# Patient Record
Sex: Female | Born: 1955 | Race: White | Hispanic: No | Marital: Single | Smoking: Current some day smoker
Health system: Southern US, Community
[De-identification: ages and names within clinical notes are randomized; demographics above are authoritative.]

## PROBLEM LIST (undated history)

## (undated) DIAGNOSIS — E785 Hyperlipidemia, unspecified: Secondary | ICD-10-CM

## (undated) DIAGNOSIS — I10 Essential (primary) hypertension: Secondary | ICD-10-CM

## (undated) DIAGNOSIS — I639 Cerebral infarction, unspecified: Secondary | ICD-10-CM

## (undated) DIAGNOSIS — J449 Chronic obstructive pulmonary disease, unspecified: Secondary | ICD-10-CM

## (undated) DIAGNOSIS — I219 Acute myocardial infarction, unspecified: Secondary | ICD-10-CM

## (undated) DIAGNOSIS — I519 Heart disease, unspecified: Secondary | ICD-10-CM

## (undated) HISTORY — DX: Chronic obstructive pulmonary disease, unspecified: J44.9

## (undated) HISTORY — PX: TUBAL LIGATION: SHX77

## (undated) HISTORY — DX: Hyperlipidemia, unspecified: E78.5

## (undated) HISTORY — DX: Acute myocardial infarction, unspecified: I21.9

## (undated) HISTORY — PX: APPENDECTOMY: SHX54

## (undated) HISTORY — DX: Essential (primary) hypertension: I10

## (undated) HISTORY — DX: Heart disease, unspecified: I51.9

## (undated) HISTORY — DX: Cerebral infarction, unspecified: I63.9

## (undated) HISTORY — PX: CORONARY ANGIOPLASTY WITH STENT PLACEMENT: SHX49

---

## 2003-07-25 DIAGNOSIS — I219 Acute myocardial infarction, unspecified: Secondary | ICD-10-CM

## 2003-07-25 HISTORY — DX: Acute myocardial infarction, unspecified: I21.9

## 2003-10-11 ENCOUNTER — Inpatient Hospital Stay (HOSPITAL_COMMUNITY): Admission: EM | Admit: 2003-10-11 | Discharge: 2003-10-13 | Payer: Self-pay | Admitting: *Deleted

## 2013-11-19 ENCOUNTER — Institutional Professional Consult (permissible substitution): Payer: Self-pay | Admitting: Internal Medicine

## 2013-11-25 ENCOUNTER — Encounter: Payer: Self-pay | Admitting: Internal Medicine

## 2013-11-25 ENCOUNTER — Encounter (INDEPENDENT_AMBULATORY_CARE_PROVIDER_SITE_OTHER): Payer: Self-pay

## 2013-11-25 ENCOUNTER — Institutional Professional Consult (permissible substitution): Payer: Medicare Other | Admitting: Internal Medicine

## 2013-11-25 ENCOUNTER — Ambulatory Visit (INDEPENDENT_AMBULATORY_CARE_PROVIDER_SITE_OTHER): Payer: Medicare Other | Admitting: Internal Medicine

## 2013-11-25 ENCOUNTER — Ambulatory Visit (INDEPENDENT_AMBULATORY_CARE_PROVIDER_SITE_OTHER)
Admission: RE | Admit: 2013-11-25 | Discharge: 2013-11-25 | Disposition: A | Discharge status: Home / Self Care | Payer: Medicare Other | Source: Ambulatory Visit | Attending: Internal Medicine | Admitting: Internal Medicine

## 2013-11-25 VITALS — BP 140/90 | HR 67 | Temp 98.1°F | Ht 64.0 in | Wt 201.6 lb

## 2013-11-25 DIAGNOSIS — F17200 Nicotine dependence, unspecified, uncomplicated: Secondary | ICD-10-CM | POA: Insufficient documentation

## 2013-11-25 DIAGNOSIS — IMO0001 Reserved for inherently not codable concepts without codable children: Secondary | ICD-10-CM | POA: Insufficient documentation

## 2013-11-25 DIAGNOSIS — J449 Chronic obstructive pulmonary disease, unspecified: Secondary | ICD-10-CM

## 2013-11-25 DIAGNOSIS — I10 Essential (primary) hypertension: Secondary | ICD-10-CM

## 2013-11-25 MED ORDER — VALSARTAN 160 MG PO TABS: 160.0000 mg | ORAL_TABLET | Freq: Every day | ORAL | Status: DC

## 2013-11-25 NOTE — Assessment & Plan Note (Signed)
ACE inhibitors are problematic in  pts with airway complaints because  even experienced pulmonologists can't always distinguish ace effects from copd/asthma.  By themselves they don't actually cause a problem, much like oxygen can't by itself start a fire, but they certainly serve as a powerful catalyst or enhancer for any "fire"  or inflammatory process in the upper airway, be it caused by an ET  tube or more commonly reflux (especially in the obese or pts with known GERD or who are on biphoshonates).    In the era of ARB near equivalency until we have a better handle on the reversibility of the airway problem, it just makes sense to avoid ACEI  entirely in the short run and then decide later, having established a level of airway control using a reasonable limited regimen, whether to add back ace but even then being very careful to observe the pt for worsening airway control and number of meds used/ needed to control symptoms.    Try off lisinopril and on diovan 160 mg daily

## 2013-11-25 NOTE — Assessment & Plan Note (Signed)
>   3 min discussion I reviewed the Flethcher curve with patient that basically indicates  if you quit smoking when your best day FEV1 is still well preserved (as is likely  the case here)  it is highly unlikely you will progress to severe disease and informed the patient there was no medication on the market that has proven to change the curve or the likelihood of progression.  Therefore stopping smoking and maintaining abstinence is the most important aspect of care, not choice of inhalers or for that matter, doctors.

## 2013-11-25 NOTE — Progress Notes (Signed)
   Subjective:    Patient ID: Martha Mueller, female    DOB: 08-26-1955  MRN: [id number]  HPI  19 yowf smoker with doe x 2005 with some allergies to grass as child but never wheezed or needed inhalers referred by Yetta Barre 11/25/2013 to pulmonary clinic with ? Copd   11/25/2013 1st [facility] office visit/ Wert  Chief Complaint  Patient presents with  . Pulmonary Consult    Referred Dr. Kennith Maes.  Pt c/o SOB for the past several years- gradually getting worse.  She gets SOB with walking minimal distances and with daily activities like bathing. Her cough is prod with minimal white sputum- esp in the am.   on inhalers since 2011 breathing no better on advair or symbicort ? While on ACEi > hfa saba works best Choking at night but no excess mucus, wakes her from sound sleep, uses saba and back to sleep after that, assoc with overt HB  No obvious other patterns in day to day or daytime variabilty or assoc chronic cough or cp or chest tightness, subjective wheeze or overt sinus   symptoms. No unusual exp hx or h/o childhood pna/ asthma or knowledge of premature birth.  Sleeping ok without nocturnal  or early am exacerbation  of respiratory  c/o's or need for noct saba. Also denies any obvious fluctuation of symptoms with weather or environmental changes or other aggravating or alleviating factors except as outlined above   Current Medications, Allergies, Complete Past Medical History, Past Surgical History, Family History, and Social History were reviewed in [employer] record.               Review of Systems  Constitutional: Negative for fever, chills and unexpected weight change.  HENT: Negative for congestion, dental problem, ear pain, nosebleeds, postnasal drip, rhinorrhea, sinus pressure, sneezing, sore throat, trouble swallowing and voice change.   Eyes: Negative for visual disturbance.  Respiratory: Positive for cough and shortness of breath. Negative  for choking.   Cardiovascular: Negative for chest pain and leg swelling.  Gastrointestinal: Negative for vomiting, abdominal pain and diarrhea.  Genitourinary: Negative for difficulty urinating.       Acid Heartburn  Musculoskeletal: Negative for arthralgias.  Skin: Negative for rash.  Neurological: Negative for tremors, syncope and headaches.  Hematological: Does not bruise/bleed easily.       Objective:   Physical Exam  amb wf nad  Wt Readings from Last 3 Encounters:  11/25/13 201 lb 9.6 oz (91.445 kg)      HEENT: nl dentition, turbinates, and orophanx. Nl external ear canals without cough reflex   NECK :  without JVD/Nodes/TM/ nl carotid upstrokes bilaterally   LUNGS: no acc muscle use, clear to A and P bilaterally without cough on insp or exp maneuvers   CV:  RRR  no s3 or murmur or increase in P2, no edema   ABD:  soft and nontender with nl excursion in the supine position. No bruits or organomegaly, bowel sounds nl  MS:  warm without deformities, calf tenderness, cyanosis or clubbing  SKIN: warm and dry without lesions    NEURO:  alert, approp, no deficits    CXR  11/25/2013 :   COPD/chronic bronchitis and scarring. No acute superimposed process.       Assessment & Plan:

## 2013-11-25 NOTE — Patient Instructions (Addendum)
Try off lisinopril  Start diovan (valsartan) 160 mg daily   Continue spiriva each am   Only use your albuterol (proair) as a rescue medication to be used if you can't catch your breath by resting or doing a relaxed purse lip breathing pattern.  - The less you use it, the better it will work when you need it. - Ok to use up to 2 puffs  every 4 hours if you must but call for immediate appointment if use goes up over your usual need - Don't leave home without it !!  (think of it like the spare tire for your car)   Please remember to go to the  x-ray department downstairs for your tests - we will call you with the results when they are available.    Please schedule a follow up office visit in 6 weeks, call sooner if needed with pfts

## 2013-11-25 NOTE — Progress Notes (Signed)
Quick Note:  Spoke with pt and notified of results per Dr. Wert. Pt verbalized understanding and denied any questions.  ______ 

## 2013-11-25 NOTE — Assessment & Plan Note (Signed)
DDX of  difficult airways managment all start with A and  include Adherence, Ace Inhibitors, Acid Reflux, Active Sinus Disease, Alpha 1 Antitripsin deficiency, Anxiety masquerading as Airways dz,  ABPA,  allergy(esp in young), Aspiration (esp in elderly), Adverse effects of DPI,  Active smokers, plus two Bs  = Bronchiectasis and Beta blocker use..and one C= CHF  Adherence is always the initial "prime suspect" and is a multilayered concern that requires a "trust but verify" approach in every patient - starting with knowing how to use medications, especially inhalers, correctly, keeping up with refills and understanding the fundamental difference between maintenance and prns vs those medications only taken for a very short course and then stopped and not refilled.  The proper method of use, as well as anticipated side effects, of a metered-dose inhaler are discussed and demonstrated to the patient. Improved effectiveness after extensive coaching during this visit to a level of approximately  75% so ok to continue prn saba   ? acei effects, esp with noct choking > try off, see hbp  ? Acid (or non-acid) GERD > always difficult to exclude as up to 75% of pts in some series report no assoc GI/ Heartburn symptoms> rec max (24h)  acid suppression and diet restrictions/ reviewed and instructions given in writing.

## 2013-12-05 ENCOUNTER — Encounter: Payer: Self-pay | Admitting: Cardiology

## 2013-12-05 ENCOUNTER — Ambulatory Visit (INDEPENDENT_AMBULATORY_CARE_PROVIDER_SITE_OTHER): Payer: Medicare Other | Admitting: Cardiology

## 2013-12-05 VITALS — BP 192/99 | HR 73 | Ht 64.0 in | Wt 200.8 lb

## 2013-12-05 DIAGNOSIS — I252 Old myocardial infarction: Secondary | ICD-10-CM

## 2013-12-05 DIAGNOSIS — F17200 Nicotine dependence, unspecified, uncomplicated: Secondary | ICD-10-CM

## 2013-12-05 DIAGNOSIS — E669 Obesity, unspecified: Secondary | ICD-10-CM

## 2013-12-05 DIAGNOSIS — I10 Essential (primary) hypertension: Secondary | ICD-10-CM

## 2013-12-05 DIAGNOSIS — I2510 Atherosclerotic heart disease of native coronary artery without angina pectoris: Secondary | ICD-10-CM

## 2013-12-05 DIAGNOSIS — I208 Other forms of angina pectoris: Secondary | ICD-10-CM | POA: Insufficient documentation

## 2013-12-05 DIAGNOSIS — J449 Chronic obstructive pulmonary disease, unspecified: Secondary | ICD-10-CM

## 2013-12-05 NOTE — Progress Notes (Signed)
[facility] [address], [place]  [address] Phone: [phone] Fax:  [phone]  Date:  12/05/2013   ID:  Martha Mueller, DOB Apr 05, 1956, MRN [id number]  PCP:  Ronita Hipps, MD  Pulm: Dr. Melvyn Novas  History of Present Illness: Martha Mueller is a 58 y.o. female here to establish care at the request of Dr. Helene Kelp. Moved back here from [place].   09/2003 - had MI. CAD, PCI at [facility]. Mid LAD 10/11/03, [place] 2.5 x 18.  Lost insurance, back to [place] in 2010.  Saw Dr. Ulysees Barns in [place], [place]. Had cath 2012 - stent mild in stent restenosis   No angina, no CP. No energy. Trying to quit tobacco. Gaining weight. Back pain.   Dr. Melvyn Novas change changed from lisinopril to ARB. Cough has improved. She was impressed.   HTN - white coat syndrome. Usually under reasonable control.    Wt Readings from Last 3 Encounters:  12/05/13 200 lb 12.8 oz (91.082 kg)  11/25/13 201 lb 9.6 oz (91.445 kg)     Past Medical History  Diagnosis Date  . Heart attack 2005  . Hyperlipidemia     Past Surgical History  Procedure Laterality Date  . Appendectomy    . Tubal ligation    . Coronary angioplasty with stent placement      Current Outpatient Prescriptions  Medication Sig Dispense Refill  . albuterol (PROAIR HFA) 108 (90 BASE) MCG/ACT inhaler Inhale 2 puffs into the lungs every 6 (six) hours as needed for wheezing or shortness of breath.      Marland Kitchen aspirin 81 MG tablet Take 162 mg by mouth daily.      Marland Kitchen atorvastatin (LIPITOR) 20 MG tablet Take 20 mg by mouth daily.      . citalopram (CELEXA) 20 MG tablet Take 20 mg by mouth daily.      Marland Kitchen gabapentin (NEURONTIN) 300 MG capsule Take 300 mg by mouth daily as needed.      . metoprolol succinate (TOPROL-XL) 50 MG 24 hr tablet Take 50 mg by mouth daily. Take with or immediately following a meal.      . nitroGLYCERIN (NITROSTAT) 0.4 MG SL tablet Place 0.4 mg under the tongue every 5 (five) minutes as needed for chest pain.      . ranolazine (RANEXA)  500 MG 12 hr tablet Take 500 mg by mouth 2 (two) times daily.      Marland Kitchen rOPINIRole (REQUIP) 0.5 MG tablet Take 0.5 mg by mouth daily.      Marland Kitchen tiotropium (SPIRIVA) 18 MCG inhalation capsule Place 18 mcg into inhaler and inhale daily.      . valsartan (DIOVAN) 160 MG tablet Take 1 tablet (160 mg total) by mouth daily.  30 tablet  11   No current facility-administered medications for this visit.    Allergies:   No Known Allergies  Social History:  The patient  reports that she has been smoking Cigarettes.  She has a 50 pack-year smoking history. She has never used smokeless tobacco. She reports that she drinks alcohol. She reports that she does not use illicit drugs.   Family History  Problem Relation Age of Onset  . Emphysema Mother     smoked  . Heart disease Father   . Lung cancer Mother     smoked    ROS:  Please see the history of present illness. No claudication, no strokelike symptoms, no fevers, chills, rash, syncope, orthopnea, PND. Positive  dyspnea on exertion, positive smoking.    All other systems reviewed and negative.   PHYSICAL EXAM: VS:  BP 192/99  Pulse 73  Ht 5\' 4"  (1.626 m)  Wt 200 lb 12.8 oz (91.082 kg)  BMI 34.45 kg/m2 Well nourished, well developed, in no acute distress HEENT: normal, [place]/AT, EOMI Neck: no JVD, normal carotid upstroke, no bruit Cardiac:  normal S1, S2; RRR; no murmur Lungs:  clear to auscultation bilaterally, no active wheezing, rhonchi or rales Abd: soft, nontender, no hepatomegaly, no bruits Ext: no significant edema, palpable distal pulses Skin: warm and dry GU: deferred Neuro: no focal abnormalities noted, AAO x 3  EKG:  12/05/13-sinus rhythm, 73, nonspecific ST-T wave changes, possible anterior lateral ischemia T-wave inversion.     Labs: 10/29/13-white count 7.8, hemoglobin 17.1, platelets 215, sodium 139, potassium 3.8, creatinine 0.84, albumin 4.2, ALT 25, total cholesterol 178, LDL 104, HDL 39  ASSESSMENT AND PLAN:  1. CAD, old MI -  Dr. Ulysees Barns. Pharmacologic NUC. [facility] heart clinic on [address], [place]. Will get records. Dyspnea. No chest pain, no active angina. Continue with beta blocker, Ranexa, angiotensin receptor blocker. Atorvastatin. Aggressive secondary prevention. Mid LAD stent, drug eluting stent, mild in-stent stenosis on last catheterization in 2013. 2. COPD-Dr. Melvyn Novas. Encourage tobacco cessation. Dyspnea.  3. Cough-improved with angiotensin receptor blocker. 4. Obesity-encourage weight loss. 5. Tobacco use-encourage tobacco cessation. Expressed the importance. 6. Angina-well controlled currently. 7. Abnormal EKG-obtaining old records. She has had recent heart catheterization. If T-wave inversions are new and she continues to have worsening symptoms, we may proceed with a stress test. 8. 6 months.  9. I will see her back in 6 months.  Signed, Candee Furbish, MD [facility]  12/05/2013 10:33 AM

## 2013-12-05 NOTE — Patient Instructions (Signed)
Your physician recommends that you continue on your current medications as directed. Please refer to the Current Medication list given to you today.  Your physician wants you to follow-up in: [place] will receive a reminder letter in the mail two months in advance. If you don't receive a letter, please call our office to schedule the follow-up appointment.

## 2014-01-06 ENCOUNTER — Ambulatory Visit (INDEPENDENT_AMBULATORY_CARE_PROVIDER_SITE_OTHER): Payer: Medicare Other | Admitting: Internal Medicine

## 2014-01-06 ENCOUNTER — Encounter: Payer: Self-pay | Admitting: Internal Medicine

## 2014-01-06 VITALS — BP 142/76 | HR 69 | Ht 63.0 in | Wt 204.0 lb

## 2014-01-06 DIAGNOSIS — F17200 Nicotine dependence, unspecified, uncomplicated: Secondary | ICD-10-CM

## 2014-01-06 DIAGNOSIS — I10 Essential (primary) hypertension: Secondary | ICD-10-CM

## 2014-01-06 DIAGNOSIS — J449 Chronic obstructive pulmonary disease, unspecified: Secondary | ICD-10-CM

## 2014-01-06 MED ORDER — BUDESONIDE-FORMOTEROL FUMARATE 160-4.5 MCG/ACT IN AERO: INHALATION_SPRAY | RESPIRATORY_TRACT | Status: DC

## 2014-01-06 NOTE — Patient Instructions (Addendum)
Continue spiriva each am and add symbicort 160 Take 2 puffs first thing in am and then another 2 puffs about 12 hours later.  If better in two weeks off spiriva (like an expensive fuel additive)    Work on inhaler technique:  relax and gently blow all the way out then take a nice smooth deep breath back in, triggering the inhaler at same time you start breathing in.  Hold for up to 5 seconds if you can.  Rinse and gargle with water when done   Only use your albuterol (proair) as a rescue medication to be used if you can't catch your breath by resting or doing a relaxed purse lip breathing pattern.  - The less you use it, the better it will work when you need it. - Ok to use up to 2 puffs  every 4 hours if you must but call for immediate appointment if use goes up over your usual need - Don't leave home without it !!  (think of it like the spare tire for your car)   The key is to stop smoking completely before smoking completely stops you!   Please schedule a follow up visit in 3 months but call sooner if needed

## 2014-01-06 NOTE — Progress Notes (Signed)
PFT done today. 

## 2014-01-06 NOTE — Progress Notes (Signed)
Subjective:    Patient ID: Martha Mueller, female    DOB: 1955-11-10  MRN: [id number]    Brief patient profile:  1 yowf smoker with doe x 2005 with some allergies to grass as child but never wheezed or needed inhalers referred by Yetta Barre 11/25/2013 to pulmonary clinic with GOLD II copd with reversibility documented 01/06/14     History of Present Illness  11/25/2013 1st [facility] office visit/ Wert  Chief Complaint  Patient presents with  . Pulmonary Consult    Referred Dr. Kennith Maes.  Pt c/o SOB for the past several years- gradually getting worse.  She gets SOB with walking minimal distances and with daily activities like bathing. Her cough is prod with minimal white sputum- esp in the am.   on inhalers since 2011 breathing no better on advair or symbicort ? While on ACEi > hfa saba works best Choking at night but no excess mucus, wakes her from sound sleep, uses saba and back to sleep after that, assoc with overt HB Try off lisinopril Start diovan (valsartan) 160 mg daily  Continue spiriva each am  Only use your albuterol (proair) as a rescue medication     01/06/2014 f/u ov/Wert re: GOLD II with 30% reversibilty/ cough gone and rare saba  Chief Complaint  Patient presents with  . Follow-up    review PFT.  Pt c/o SOB with exertion.  Limited to 7/11 can't do HT s buggy hfa < 50% @  baseline and has "tried symbicort"  No obvious day to day or daytime variabilty or assoc chronic cough or cp or chest tightness, subjective wheeze overt sinus or hb symptoms. No unusual exp hx or h/o childhood pna/ asthma or knowledge of premature birth.  Sleeping ok without nocturnal  or early am exacerbation  of respiratory  c/o's or need for noct saba. Also denies any obvious fluctuation of symptoms with weather or environmental changes or other aggravating or alleviating factors except as outlined above   Current Medications, Allergies, Complete Past Medical History, Past Surgical  History, Family History, and Social History were reviewed in [employer] record.  ROS  The following are not active complaints unless bolded sore throat, dysphagia, dental problems, itching, sneezing,  nasal congestion or excess/ purulent secretions, ear ache,   fever, chills, sweats, unintended wt loss, pleuritic or exertional cp, hemoptysis,  orthopnea pnd or leg swelling, presyncope, palpitations, heartburn, abdominal pain, anorexia, nausea, vomiting, diarrhea  or change in bowel or urinary habits, change in stools or urine, dysuria,hematuria,  rash, arthralgias, visual complaints, headache, numbness weakness or ataxia or problems with walking or coordination,  change in mood/affect or memory.       .                      Objective:   Physical Exam  amb wf nad  Wt Readings from Last 3 Encounters:  01/06/14 204 lb (92.534 kg)  12/05/13 200 lb 12.8 oz (91.082 kg)  11/25/13 201 lb 9.6 oz (91.445 kg)         HEENT: nl dentition, turbinates, and orophanx. Nl external ear canals without cough reflex   NECK :  without JVD/Nodes/TM/ nl carotid upstrokes bilaterally   LUNGS: no acc muscle use, clear to A and P though BS slt distant with min increase exp time    CV:  RRR  no s3 or murmur or increase in P2, no edema   ABD:  soft and nontender with nl excursion in the supine position. No bruits or organomegaly, bowel sounds nl  MS:  warm without deformities, calf tenderness, cyanosis or clubbing  SKIN: warm and dry without lesions    NEURO:  alert, approp, no deficits    CXR  11/25/2013 :   COPD/chronic bronchitis and scarring. No acute superimposed process.       Assessment & Plan:

## 2014-01-07 NOTE — Assessment & Plan Note (Signed)
Try off acei 11/25/2013 due to pseudowheeze/ cough > resolved  Adequate control on present rx, reviewed > no change in rx needed  - keep off acei

## 2014-01-07 NOTE — Assessment & Plan Note (Signed)

## 2014-01-07 NOTE — Assessment & Plan Note (Addendum)
-   trial off acei  11/25/2013 > cough resolved  - PFTs 01/06/14 FEV1  1.26 (51%) ratio 58 p  30% resp to SABA, and DLCO 65 and corrects to 76%   DDX of  difficult airways management all start with A and  include Adherence, Ace Inhibitors, Acid Reflux, Active Sinus Disease, Alpha 1 Antitripsin deficiency, Anxiety masquerading as Airways dz,  ABPA,  allergy(esp in young), Aspiration (esp in elderly), Adverse effects of DPI,  Active smokers, plus two Bs  = Bronchiectasis and Beta blocker use..and one C= CHF  Adherence is always the initial "prime suspect" and is a multilayered concern that requires a "trust but verify" approach in every patient - starting with knowing how to use medications, especially inhalers, correctly, keeping up with refills and understanding the fundamental difference between maintenance and prns vs those medications only taken for a very short course and then stopped and not refilled.  The proper method of use, as well as anticipated side effects, of a metered-dose inhaler are discussed and demonstrated to the patient. Improved effectiveness after extensive coaching during this visit to a level of approximately  75% from a baseline of 25% so worth rechallenging her with symbicort 160 2bid based on the reversibility on today's pfts  Active smoking > see separate a/p    Each maintenance medication was reviewed in detail including most importantly the difference between maintenance and as needed and under what circumstances the prns are to be used.  Please see instructions for details which were reviewed in writing and the patient given a copy.

## 2014-01-08 LAB — PULMONARY FUNCTION TEST
DL/VA % pred: 76 %
DL/VA: 3.57 ml/min/mmHg/L
DLCO UNC % PRED: 65 %
DLCO unc: 14.95 ml/min/mmHg
FEF 25-75 PRE: 0.37 L/s
FEF 25-75 Post: 0.77 L/sec
FEF2575-%Change-Post: 109 %
FEF2575-%Pred-Post: 33 %
FEF2575-%Pred-Pre: 15 %
FEV1-%CHANGE-POST: 30 %
FEV1-%Pred-Post: 51 %
FEV1-%Pred-Pre: 39 %
FEV1-PRE: 0.97 L
FEV1-Post: 1.26 L
FEV1FVC-%Change-Post: 11 %
FEV1FVC-%Pred-Pre: 66 %
FEV6-%Change-Post: 19 %
FEV6-%Pred-Post: 68 %
FEV6-%Pred-Pre: 57 %
FEV6-POST: 2.12 L
FEV6-PRE: 1.78 L
FEV6FVC-%CHANGE-POST: 1 %
FEV6FVC-%PRED-PRE: 97 %
FEV6FVC-%Pred-Post: 99 %
FVC-%Change-Post: 16 %
FVC-%PRED-PRE: 58 %
FVC-%Pred-Post: 68 %
FVC-PRE: 1.87 L
FVC-Post: 2.19 L
POST FEV6/FVC RATIO: 97 %
Post FEV1/FVC ratio: 58 %
Pre FEV1/FVC ratio: 52 %
Pre FEV6/FVC Ratio: 95 %
RV % PRED: 144 %
RV: 2.79 L
TLC % pred: 101 %
TLC: 4.98 L

## 2014-03-09 DIAGNOSIS — H53461 Homonymous bilateral field defects, right side: Secondary | ICD-10-CM | POA: Insufficient documentation

## 2014-03-18 DIAGNOSIS — I639 Cerebral infarction, unspecified: Secondary | ICD-10-CM | POA: Insufficient documentation

## 2014-04-29 ENCOUNTER — Ambulatory Visit: Payer: Medicare Other | Admitting: Internal Medicine

## 2014-11-09 ENCOUNTER — Other Ambulatory Visit: Payer: Self-pay | Admitting: Internal Medicine

## 2014-11-18 ENCOUNTER — Ambulatory Visit: Payer: Self-pay | Admitting: Internal Medicine

## 2014-11-20 ENCOUNTER — Encounter: Payer: Self-pay | Admitting: Internal Medicine

## 2014-11-20 ENCOUNTER — Ambulatory Visit (INDEPENDENT_AMBULATORY_CARE_PROVIDER_SITE_OTHER): Payer: Medicare Other | Admitting: Internal Medicine

## 2014-11-20 VITALS — BP 112/66 | HR 77 | Ht 64.0 in | Wt 204.0 lb

## 2014-11-20 DIAGNOSIS — Z720 Tobacco use: Secondary | ICD-10-CM

## 2014-11-20 DIAGNOSIS — J449 Chronic obstructive pulmonary disease, unspecified: Secondary | ICD-10-CM | POA: Diagnosis not present

## 2014-11-20 DIAGNOSIS — F17200 Nicotine dependence, unspecified, uncomplicated: Secondary | ICD-10-CM

## 2014-11-20 MED ORDER — VALSARTAN 160 MG PO TABS: 160.0000 mg | ORAL_TABLET | Freq: Every day | ORAL | Status: DC

## 2014-11-20 MED ORDER — BUDESONIDE-FORMOTEROL FUMARATE 160-4.5 MCG/ACT IN AERO: INHALATION_SPRAY | RESPIRATORY_TRACT | Status: DC

## 2014-11-20 MED ORDER — ALBUTEROL SULFATE HFA 108 (90 BASE) MCG/ACT IN AERS: 2.0000 | INHALATION_SPRAY | Freq: Four times a day (QID) | RESPIRATORY_TRACT | Status: AC | PRN

## 2014-11-20 NOTE — Patient Instructions (Signed)
The key is to stop smoking completely before smoking completely stops you!     If you are satisfied with your treatment plan,  let your doctor know and he/she can either refill your medications or you can return here when your prescription runs out.     If in any way you are not 100% satisfied,  please tell us.  If 100% better, tell your friends!  Pulmonary follow up is as needed        

## 2014-11-20 NOTE — Progress Notes (Signed)
Subjective:    Patient ID: Martha Mueller, female    DOB: 04/04/1956  MRN: [id number]    Brief patient profile:  39 yowf smoker with doe x 2005 with some allergies to grass as child but never wheezed or needed inhalers referred by Martha Mueller 11/25/2013 to pulmonary clinic with GOLD II copd with reversibility documented 01/06/14     History of Present Illness  11/25/2013 1st [facility] office visit/ Martha Mueller  Chief Complaint  Patient presents with  . Pulmonary Consult    Referred Dr. Kennith Mueller.  Pt c/o SOB for the past several years- gradually getting worse.  She gets SOB with walking minimal distances and with daily activities like bathing. Her cough is prod with minimal white sputum- esp in the am.   on inhalers since 2011 breathing no better on advair or symbicort ? While on ACEi > hfa saba works best Choking at night but no excess mucus, wakes her from sound sleep, uses saba and back to sleep after that, assoc with overt HB Try off lisinopril Start diovan (valsartan) 160 mg daily  Continue spiriva each am  Only use your albuterol (proair) as a rescue medication     01/06/2014 f/u ov/Martha Mueller re: GOLD II with 30% reversibilty/ cough gone and rare saba  Chief Complaint  Patient presents with  . Follow-up    review PFT.  Pt c/o SOB with exertion.  Limited to 7/11 can't do HT s buggy hfa < 50% @  baseline and has "tried symbicort" rec Continue spiriva each am and add symbicort 160 Take 2 puffs first thing in am and then another 2 puffs about 12 hours later.  If better in two weeks off spiriva (like an expensive fuel additive)  Work on inhaler technique:   Only use your albuterol  As needed  The key is to stop smoking completely before smoking completely stops you!    11/20/2014 f/u ov/Martha Mueller re: copd still smoking GOLD II / just on symb and rare saba, no worse off Martha Mueller Complaint  Patient presents with  . Follow-up    Pt states breathing is doing well. She is here for  refills on symbicort, proair and valsartan. She is using proair once per day on average.     Not limited by breathing from desired activities     No obvious day to day or daytime variabilty or assoc chronic cough or cp or chest tightness, subjective wheeze overt sinus or hb symptoms. No unusual exp hx or h/o childhood pna/ asthma or knowledge of premature birth.  Sleeping ok without nocturnal  or early am exacerbation  of respiratory  c/o's or need for noct saba. Also denies any obvious fluctuation of symptoms with weather or environmental changes or other aggravating or alleviating factors except as outlined above   Current Medications, Allergies, Complete Past Medical History, Past Surgical History, Family History, and Social History were reviewed in [employer] record.  ROS  The following are not active complaints unless bolded sore throat, dysphagia, dental problems, itching, sneezing,  nasal congestion or excess/ purulent secretions, ear ache,   fever, chills, sweats, unintended wt loss, pleuritic or exertional cp, hemoptysis,  orthopnea pnd or leg swelling, presyncope, palpitations, heartburn, abdominal pain, anorexia, nausea, vomiting, diarrhea  or change in bowel or urinary habits, change in stools or urine, dysuria,hematuria,  rash, arthralgias, visual complaints, headache, numbness weakness or ataxia or problems with walking or coordination,  change in mood/affect or memory.       Marland Kitchen  Objective:   Physical Exam  amb wf nad  11/20/2014       204  Wt Readings from Last 3 Encounters:  01/06/14 204 lb (92.534 kg)  12/05/13 200 lb 12.8 oz (91.082 kg)  11/25/13 201 lb 9.6 oz (91.445 kg)         HEENT: nl dentition, turbinates, and orophanx. Nl external ear canals without cough reflex   NECK :  without JVD/Nodes/TM/ nl carotid upstrokes bilaterally   LUNGS: no acc muscle use, clear to A and P though BS slt distant bilaterally   with min increase exp time    CV:  RRR  no s3 or murmur or increase in P2, no edema   ABD:  soft and nontender with nl excursion in the supine position. No bruits or organomegaly, bowel sounds nl  MS:  warm without deformities, calf tenderness, cyanosis or clubbing  SKIN: warm and dry without lesions    NEURO:  alert, approp, no deficits    CXR  11/25/2013 :   COPD/chronic bronchitis and scarring. No acute superimposed process.       Assessment & Plan:

## 2014-11-21 ENCOUNTER — Encounter: Payer: Self-pay | Admitting: Internal Medicine

## 2014-11-21 NOTE — Assessment & Plan Note (Signed)
>   3 min  I took an extended  opportunity with this patient to outline the consequences of continued cigarette use  in airway disorders based on all the data we have from the multiple national lung health studies (perfomed over decades at millions of dollars in cost)  indicating that smoking cessation is the most important aspect of care.    Referred back to primary care for f/u but reminded her she needs to commit to quit before anything we can do will help her

## 2014-11-21 NOTE — Assessment & Plan Note (Addendum)
-   trial off acei  11/25/2013 > cough resolved  - PFTs 01/06/14 FEV1  1.26 (51%) ratio 58 p  30% resp to SABA, and DLCO 65 and corrects to 76%  - Trial off spiriva 01/07/14 > no change     - 11/20/2014 p extensive coaching HFA effectiveness =    75%    I had an extended final summary discussion with the patient reviewing all relevant studies completed to date and  lasting 15 to 20 minutes of a 25 minute visit on the following issues:    1) she only has moderate copd at this point but she's still smoking  2)  I reviewed the Flethcher curve with patient that basically indicates  if you quit smoking when your best day FEV1 is still well preserved (as is relatively true here)  it is highly unlikely you will progress to severe disease and informed the patient there was no medication on the market that has proven to change the curve or the likelihood of progression.  Therefore stopping smoking and maintaining abstinence is the most important aspect of care, not choice of inhalers or for that matter, doctors.    3) Pulmonary f/u is therefore as needed should she have progressive symptoms or increased need for saba   4) Each maintenance medication was reviewed in detail including most importantly the difference between maintenance and as needed and under what circumstances the prns are to be used.  Please see instructions for details which were reviewed in writing and the patient given a copy.     Marland Kitchen

## 2016-02-10 IMAGING — CR DG CHEST 2V
2 series · 2 of 2 positions shown · non-contrast
Comparison: [DATE]

CLINICAL DATA: Chronic shortness of breath. Cough. Smoker.
Hypertension.

EXAM:
CHEST  2 VIEW

[view not recorded (1 of 2)]
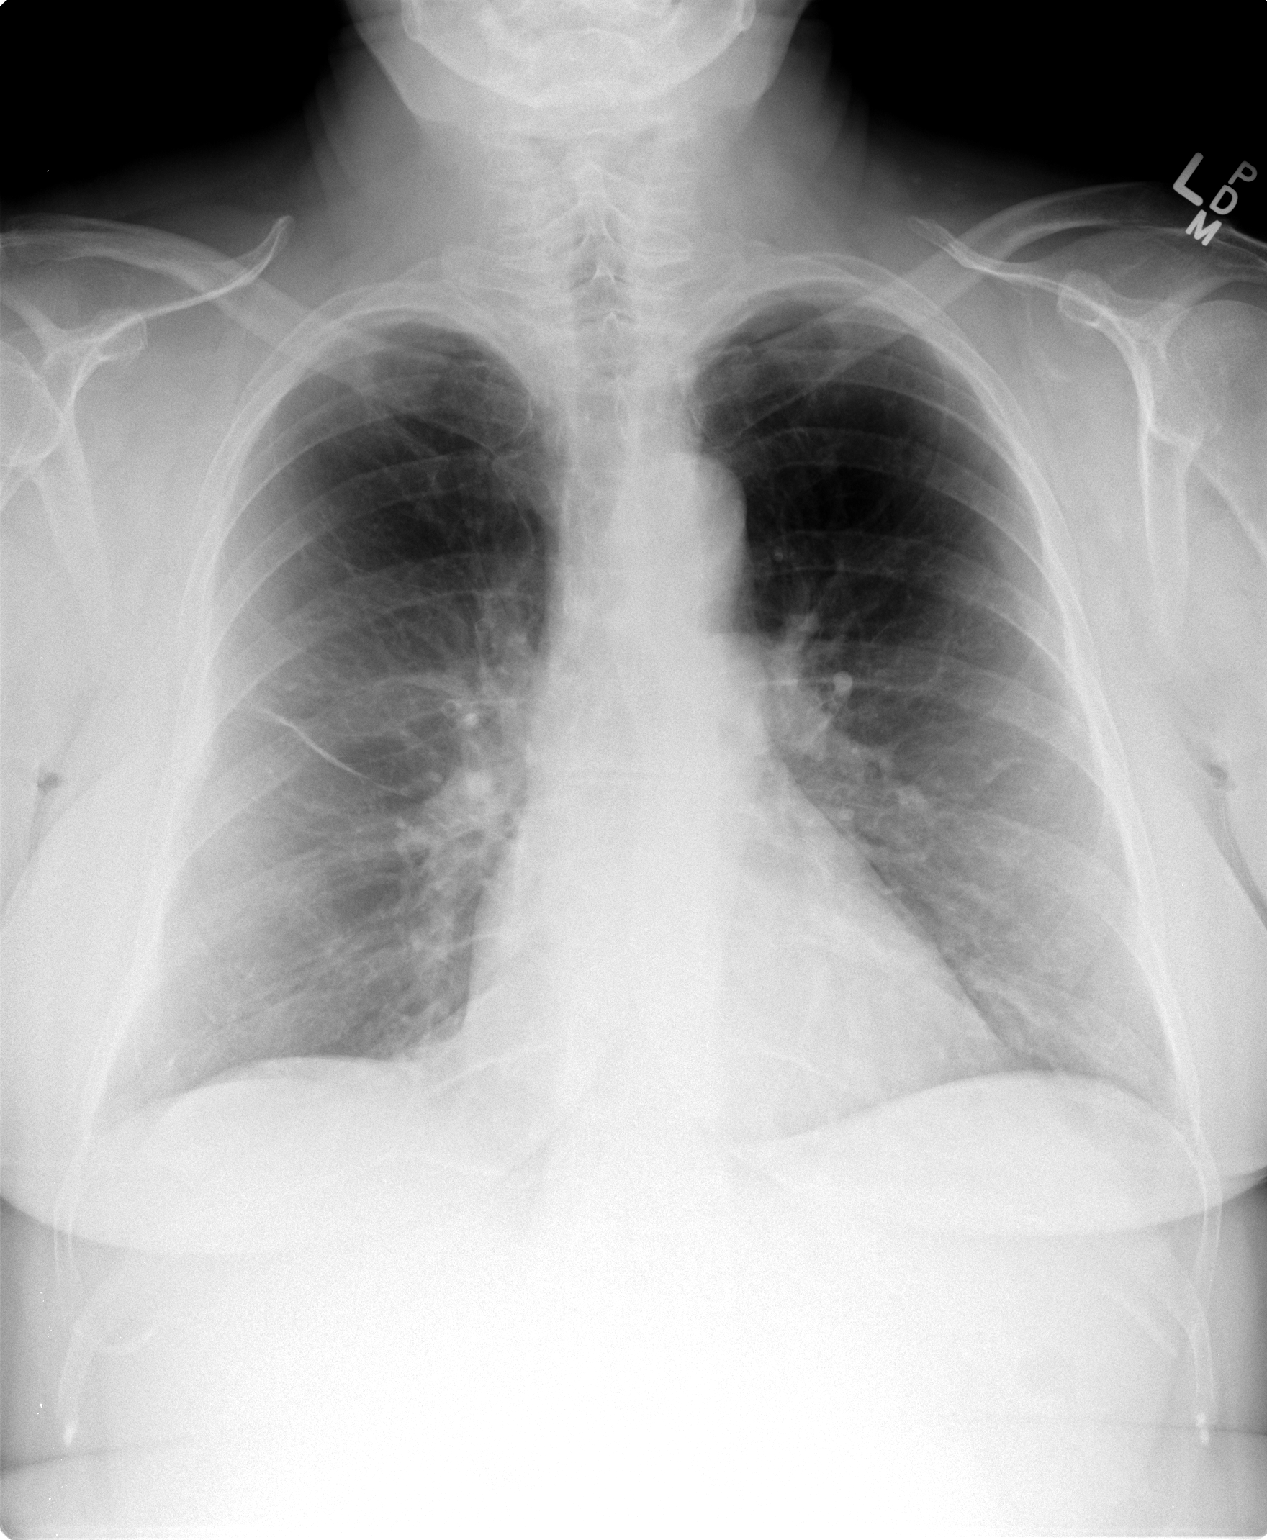

[view not recorded (2 of 2)]
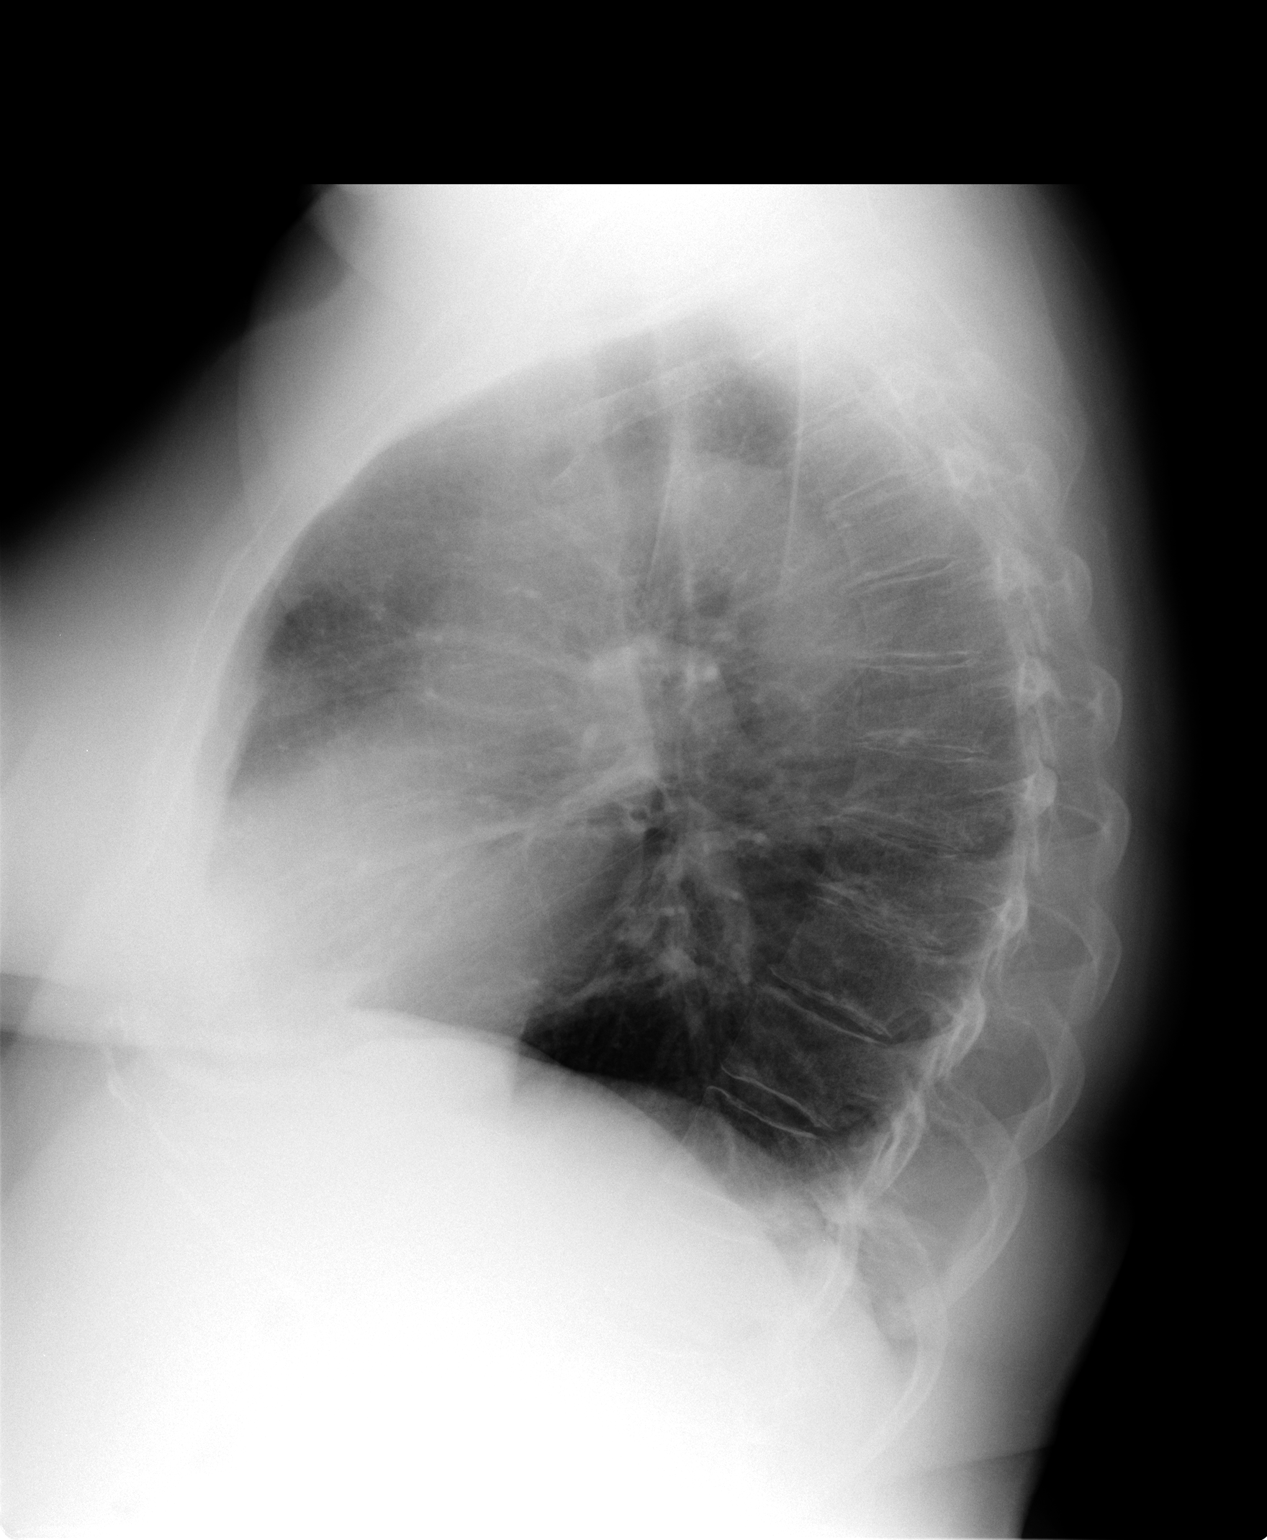

[2 of 2 positions shown; findings below may reference images not displayed]

FINDINGS: Mild hyperinflation. Midline trachea. Normal heart size and
mediastinal contours. No pleural effusion or pneumothorax. Lower
lobe predominant interstitial thickening. Linear scarring in the
inferior right upper lobe. Suspect biapical pleural parenchymal
scarring. No lobar consolidation.
IMPRESSION: COPD/chronic bronchitis and scarring. No acute superimposed process.

## 2016-03-03 DIAGNOSIS — G2581 Restless legs syndrome: Secondary | ICD-10-CM | POA: Diagnosis not present

## 2016-03-03 DIAGNOSIS — E785 Hyperlipidemia, unspecified: Secondary | ICD-10-CM | POA: Diagnosis not present

## 2016-03-03 DIAGNOSIS — I2510 Atherosclerotic heart disease of native coronary artery without angina pectoris: Secondary | ICD-10-CM | POA: Diagnosis not present

## 2016-03-03 DIAGNOSIS — Z79899 Other long term (current) drug therapy: Secondary | ICD-10-CM | POA: Diagnosis not present

## 2016-03-03 DIAGNOSIS — M8580 Other specified disorders of bone density and structure, unspecified site: Secondary | ICD-10-CM | POA: Diagnosis not present

## 2016-03-03 DIAGNOSIS — I10 Essential (primary) hypertension: Secondary | ICD-10-CM | POA: Diagnosis not present

## 2016-03-03 DIAGNOSIS — Z0000 Encounter for general adult medical examination without abnormal findings: Secondary | ICD-10-CM | POA: Diagnosis not present

## 2016-03-03 DIAGNOSIS — J449 Chronic obstructive pulmonary disease, unspecified: Secondary | ICD-10-CM | POA: Diagnosis not present

## 2016-05-18 DIAGNOSIS — I639 Cerebral infarction, unspecified: Secondary | ICD-10-CM | POA: Diagnosis not present

## 2016-05-18 DIAGNOSIS — F17210 Nicotine dependence, cigarettes, uncomplicated: Secondary | ICD-10-CM | POA: Diagnosis not present

## 2016-05-18 DIAGNOSIS — I63233 Cerebral infarction due to unspecified occlusion or stenosis of bilateral carotid arteries: Secondary | ICD-10-CM | POA: Diagnosis not present

## 2016-05-18 DIAGNOSIS — Z6835 Body mass index (BMI) 35.0-35.9, adult: Secondary | ICD-10-CM | POA: Diagnosis not present

## 2016-05-18 DIAGNOSIS — J9691 Respiratory failure, unspecified with hypoxia: Secondary | ICD-10-CM | POA: Diagnosis not present

## 2016-05-18 DIAGNOSIS — Z8673 Personal history of transient ischemic attack (TIA), and cerebral infarction without residual deficits: Secondary | ICD-10-CM | POA: Diagnosis not present

## 2016-05-18 DIAGNOSIS — Z720 Tobacco use: Secondary | ICD-10-CM | POA: Diagnosis not present

## 2016-05-18 DIAGNOSIS — R0602 Shortness of breath: Secondary | ICD-10-CM | POA: Diagnosis not present

## 2016-05-18 DIAGNOSIS — J9621 Acute and chronic respiratory failure with hypoxia: Secondary | ICD-10-CM | POA: Diagnosis not present

## 2016-05-18 DIAGNOSIS — Z23 Encounter for immunization: Secondary | ICD-10-CM | POA: Diagnosis not present

## 2016-05-18 DIAGNOSIS — R069 Unspecified abnormalities of breathing: Secondary | ICD-10-CM | POA: Diagnosis not present

## 2016-05-18 DIAGNOSIS — F418 Other specified anxiety disorders: Secondary | ICD-10-CM | POA: Diagnosis not present

## 2016-05-18 DIAGNOSIS — G459 Transient cerebral ischemic attack, unspecified: Secondary | ICD-10-CM | POA: Diagnosis not present

## 2016-05-18 DIAGNOSIS — R7303 Prediabetes: Secondary | ICD-10-CM | POA: Diagnosis not present

## 2016-05-18 DIAGNOSIS — Z79899 Other long term (current) drug therapy: Secondary | ICD-10-CM | POA: Diagnosis not present

## 2016-05-18 DIAGNOSIS — E669 Obesity, unspecified: Secondary | ICD-10-CM | POA: Diagnosis not present

## 2016-05-18 DIAGNOSIS — R4701 Aphasia: Secondary | ICD-10-CM | POA: Diagnosis not present

## 2016-05-18 DIAGNOSIS — E875 Hyperkalemia: Secondary | ICD-10-CM | POA: Diagnosis not present

## 2016-05-18 DIAGNOSIS — R29701 NIHSS score 1: Secondary | ICD-10-CM | POA: Diagnosis not present

## 2016-05-18 DIAGNOSIS — I252 Old myocardial infarction: Secondary | ICD-10-CM | POA: Diagnosis not present

## 2016-05-18 DIAGNOSIS — I10 Essential (primary) hypertension: Secondary | ICD-10-CM | POA: Diagnosis not present

## 2016-05-18 DIAGNOSIS — J441 Chronic obstructive pulmonary disease with (acute) exacerbation: Secondary | ICD-10-CM | POA: Diagnosis not present

## 2016-05-18 DIAGNOSIS — E785 Hyperlipidemia, unspecified: Secondary | ICD-10-CM | POA: Diagnosis not present

## 2016-05-18 DIAGNOSIS — R29810 Facial weakness: Secondary | ICD-10-CM | POA: Diagnosis not present

## 2016-05-19 DIAGNOSIS — I10 Essential (primary) hypertension: Secondary | ICD-10-CM | POA: Diagnosis not present

## 2016-05-19 DIAGNOSIS — R7303 Prediabetes: Secondary | ICD-10-CM | POA: Diagnosis not present

## 2016-05-19 DIAGNOSIS — Z720 Tobacco use: Secondary | ICD-10-CM | POA: Diagnosis not present

## 2016-05-19 DIAGNOSIS — J441 Chronic obstructive pulmonary disease with (acute) exacerbation: Secondary | ICD-10-CM

## 2016-05-19 DIAGNOSIS — I63233 Cerebral infarction due to unspecified occlusion or stenosis of bilateral carotid arteries: Secondary | ICD-10-CM | POA: Diagnosis not present

## 2016-05-19 DIAGNOSIS — J9691 Respiratory failure, unspecified with hypoxia: Secondary | ICD-10-CM | POA: Diagnosis not present

## 2016-05-19 DIAGNOSIS — I639 Cerebral infarction, unspecified: Secondary | ICD-10-CM

## 2016-05-20 DIAGNOSIS — I639 Cerebral infarction, unspecified: Secondary | ICD-10-CM | POA: Diagnosis not present

## 2016-05-20 DIAGNOSIS — R0602 Shortness of breath: Secondary | ICD-10-CM | POA: Diagnosis not present

## 2016-05-20 DIAGNOSIS — E669 Obesity, unspecified: Secondary | ICD-10-CM | POA: Diagnosis not present

## 2016-05-20 DIAGNOSIS — Z6835 Body mass index (BMI) 35.0-35.9, adult: Secondary | ICD-10-CM | POA: Diagnosis not present

## 2016-05-20 DIAGNOSIS — I252 Old myocardial infarction: Secondary | ICD-10-CM | POA: Diagnosis not present

## 2016-05-20 DIAGNOSIS — R7303 Prediabetes: Secondary | ICD-10-CM | POA: Diagnosis not present

## 2016-05-20 DIAGNOSIS — Z23 Encounter for immunization: Secondary | ICD-10-CM | POA: Diagnosis not present

## 2016-05-20 DIAGNOSIS — R4701 Aphasia: Secondary | ICD-10-CM | POA: Diagnosis not present

## 2016-05-20 DIAGNOSIS — R29701 NIHSS score 1: Secondary | ICD-10-CM | POA: Diagnosis not present

## 2016-05-20 DIAGNOSIS — F418 Other specified anxiety disorders: Secondary | ICD-10-CM | POA: Diagnosis not present

## 2016-05-20 DIAGNOSIS — Z720 Tobacco use: Secondary | ICD-10-CM | POA: Diagnosis not present

## 2016-05-20 DIAGNOSIS — J9691 Respiratory failure, unspecified with hypoxia: Secondary | ICD-10-CM | POA: Diagnosis not present

## 2016-05-20 DIAGNOSIS — J9621 Acute and chronic respiratory failure with hypoxia: Secondary | ICD-10-CM | POA: Diagnosis not present

## 2016-05-20 DIAGNOSIS — J441 Chronic obstructive pulmonary disease with (acute) exacerbation: Secondary | ICD-10-CM | POA: Diagnosis not present

## 2016-05-20 DIAGNOSIS — Z79899 Other long term (current) drug therapy: Secondary | ICD-10-CM | POA: Diagnosis not present

## 2016-05-20 DIAGNOSIS — F17210 Nicotine dependence, cigarettes, uncomplicated: Secondary | ICD-10-CM | POA: Diagnosis not present

## 2016-05-20 DIAGNOSIS — I10 Essential (primary) hypertension: Secondary | ICD-10-CM | POA: Diagnosis not present

## 2016-05-20 DIAGNOSIS — Z8673 Personal history of transient ischemic attack (TIA), and cerebral infarction without residual deficits: Secondary | ICD-10-CM | POA: Diagnosis not present

## 2016-05-20 DIAGNOSIS — E785 Hyperlipidemia, unspecified: Secondary | ICD-10-CM | POA: Diagnosis not present

## 2016-05-21 DIAGNOSIS — F418 Other specified anxiety disorders: Secondary | ICD-10-CM | POA: Diagnosis not present

## 2016-05-21 DIAGNOSIS — R740 Nonspecific elevation of levels of transaminase and lactic acid dehydrogenase [LDH]: Secondary | ICD-10-CM | POA: Diagnosis not present

## 2016-05-21 DIAGNOSIS — I959 Hypotension, unspecified: Secondary | ICD-10-CM | POA: Diagnosis not present

## 2016-05-21 DIAGNOSIS — Z4682 Encounter for fitting and adjustment of non-vascular catheter: Secondary | ICD-10-CM | POA: Diagnosis not present

## 2016-05-21 DIAGNOSIS — E7800 Pure hypercholesterolemia, unspecified: Secondary | ICD-10-CM | POA: Diagnosis not present

## 2016-05-21 DIAGNOSIS — Z452 Encounter for adjustment and management of vascular access device: Secondary | ICD-10-CM | POA: Diagnosis not present

## 2016-05-21 DIAGNOSIS — Z9861 Coronary angioplasty status: Secondary | ICD-10-CM | POA: Diagnosis not present

## 2016-05-21 DIAGNOSIS — N179 Acute kidney failure, unspecified: Secondary | ICD-10-CM | POA: Diagnosis not present

## 2016-05-21 DIAGNOSIS — I214 Non-ST elevation (NSTEMI) myocardial infarction: Secondary | ICD-10-CM | POA: Diagnosis not present

## 2016-05-21 DIAGNOSIS — R0682 Tachypnea, not elsewhere classified: Secondary | ICD-10-CM | POA: Diagnosis not present

## 2016-05-21 DIAGNOSIS — I10 Essential (primary) hypertension: Secondary | ICD-10-CM | POA: Diagnosis not present

## 2016-05-21 DIAGNOSIS — Z8673 Personal history of transient ischemic attack (TIA), and cerebral infarction without residual deficits: Secondary | ICD-10-CM | POA: Diagnosis not present

## 2016-05-21 DIAGNOSIS — F17210 Nicotine dependence, cigarettes, uncomplicated: Secondary | ICD-10-CM | POA: Diagnosis not present

## 2016-05-21 DIAGNOSIS — J9622 Acute and chronic respiratory failure with hypercapnia: Secondary | ICD-10-CM | POA: Diagnosis not present

## 2016-05-21 DIAGNOSIS — Z7902 Long term (current) use of antithrombotics/antiplatelets: Secondary | ICD-10-CM | POA: Diagnosis not present

## 2016-05-21 DIAGNOSIS — I110 Hypertensive heart disease with heart failure: Secondary | ICD-10-CM | POA: Diagnosis not present

## 2016-05-21 DIAGNOSIS — Z6841 Body Mass Index (BMI) 40.0 and over, adult: Secondary | ICD-10-CM | POA: Diagnosis not present

## 2016-05-21 DIAGNOSIS — J9621 Acute and chronic respiratory failure with hypoxia: Secondary | ICD-10-CM | POA: Diagnosis not present

## 2016-05-21 DIAGNOSIS — I509 Heart failure, unspecified: Secondary | ICD-10-CM | POA: Diagnosis not present

## 2016-05-21 DIAGNOSIS — Z9981 Dependence on supplemental oxygen: Secondary | ICD-10-CM | POA: Diagnosis not present

## 2016-05-21 DIAGNOSIS — R7303 Prediabetes: Secondary | ICD-10-CM | POA: Diagnosis not present

## 2016-05-21 DIAGNOSIS — Z79899 Other long term (current) drug therapy: Secondary | ICD-10-CM | POA: Diagnosis not present

## 2016-05-21 DIAGNOSIS — G4733 Obstructive sleep apnea (adult) (pediatric): Secondary | ICD-10-CM | POA: Diagnosis not present

## 2016-05-21 DIAGNOSIS — R0602 Shortness of breath: Secondary | ICD-10-CM | POA: Diagnosis not present

## 2016-05-21 DIAGNOSIS — I252 Old myocardial infarction: Secondary | ICD-10-CM | POA: Diagnosis not present

## 2016-05-21 DIAGNOSIS — I517 Cardiomegaly: Secondary | ICD-10-CM | POA: Diagnosis not present

## 2016-05-21 DIAGNOSIS — J811 Chronic pulmonary edema: Secondary | ICD-10-CM | POA: Diagnosis not present

## 2016-05-21 DIAGNOSIS — J441 Chronic obstructive pulmonary disease with (acute) exacerbation: Secondary | ICD-10-CM | POA: Diagnosis not present

## 2016-05-22 DIAGNOSIS — J9600 Acute respiratory failure, unspecified whether with hypoxia or hypercapnia: Secondary | ICD-10-CM | POA: Diagnosis not present

## 2016-05-22 DIAGNOSIS — Z9911 Dependence on respirator [ventilator] status: Secondary | ICD-10-CM | POA: Diagnosis not present

## 2016-05-22 DIAGNOSIS — F17210 Nicotine dependence, cigarettes, uncomplicated: Secondary | ICD-10-CM | POA: Diagnosis not present

## 2016-05-22 DIAGNOSIS — R9431 Abnormal electrocardiogram [ECG] [EKG]: Secondary | ICD-10-CM | POA: Diagnosis not present

## 2016-05-22 DIAGNOSIS — J9691 Respiratory failure, unspecified with hypoxia: Secondary | ICD-10-CM | POA: Diagnosis not present

## 2016-05-22 DIAGNOSIS — I214 Non-ST elevation (NSTEMI) myocardial infarction: Secondary | ICD-10-CM | POA: Diagnosis not present

## 2016-05-22 DIAGNOSIS — Z8249 Family history of ischemic heart disease and other diseases of the circulatory system: Secondary | ICD-10-CM | POA: Diagnosis not present

## 2016-05-23 DIAGNOSIS — Z9911 Dependence on respirator [ventilator] status: Secondary | ICD-10-CM | POA: Diagnosis not present

## 2016-05-23 DIAGNOSIS — I214 Non-ST elevation (NSTEMI) myocardial infarction: Secondary | ICD-10-CM | POA: Diagnosis not present

## 2016-05-23 DIAGNOSIS — J9690 Respiratory failure, unspecified, unspecified whether with hypoxia or hypercapnia: Secondary | ICD-10-CM | POA: Diagnosis not present

## 2016-05-23 DIAGNOSIS — J9600 Acute respiratory failure, unspecified whether with hypoxia or hypercapnia: Secondary | ICD-10-CM | POA: Diagnosis not present

## 2016-05-23 DIAGNOSIS — R9431 Abnormal electrocardiogram [ECG] [EKG]: Secondary | ICD-10-CM | POA: Diagnosis not present

## 2016-05-24 DIAGNOSIS — J9600 Acute respiratory failure, unspecified whether with hypoxia or hypercapnia: Secondary | ICD-10-CM | POA: Diagnosis not present

## 2016-05-24 DIAGNOSIS — J9690 Respiratory failure, unspecified, unspecified whether with hypoxia or hypercapnia: Secondary | ICD-10-CM | POA: Diagnosis not present

## 2016-05-24 DIAGNOSIS — Z9911 Dependence on respirator [ventilator] status: Secondary | ICD-10-CM | POA: Diagnosis not present

## 2016-05-24 DIAGNOSIS — I214 Non-ST elevation (NSTEMI) myocardial infarction: Secondary | ICD-10-CM | POA: Diagnosis not present

## 2016-05-25 DIAGNOSIS — I214 Non-ST elevation (NSTEMI) myocardial infarction: Secondary | ICD-10-CM | POA: Diagnosis not present

## 2016-05-25 DIAGNOSIS — J9690 Respiratory failure, unspecified, unspecified whether with hypoxia or hypercapnia: Secondary | ICD-10-CM | POA: Diagnosis not present

## 2016-05-25 DIAGNOSIS — I252 Old myocardial infarction: Secondary | ICD-10-CM | POA: Diagnosis not present

## 2016-06-01 DIAGNOSIS — Z720 Tobacco use: Secondary | ICD-10-CM | POA: Diagnosis not present

## 2016-06-01 DIAGNOSIS — Z8673 Personal history of transient ischemic attack (TIA), and cerebral infarction without residual deficits: Secondary | ICD-10-CM | POA: Diagnosis not present

## 2016-06-01 DIAGNOSIS — S60229A Contusion of unspecified hand, initial encounter: Secondary | ICD-10-CM | POA: Diagnosis not present

## 2016-06-19 DIAGNOSIS — G2581 Restless legs syndrome: Secondary | ICD-10-CM | POA: Diagnosis not present

## 2016-06-19 DIAGNOSIS — R5383 Other fatigue: Secondary | ICD-10-CM | POA: Diagnosis not present

## 2016-06-19 DIAGNOSIS — J449 Chronic obstructive pulmonary disease, unspecified: Secondary | ICD-10-CM | POA: Diagnosis not present

## 2016-06-19 DIAGNOSIS — F17210 Nicotine dependence, cigarettes, uncomplicated: Secondary | ICD-10-CM | POA: Diagnosis not present

## 2016-06-19 DIAGNOSIS — G4733 Obstructive sleep apnea (adult) (pediatric): Secondary | ICD-10-CM | POA: Diagnosis not present

## 2016-06-22 DIAGNOSIS — J9691 Respiratory failure, unspecified with hypoxia: Secondary | ICD-10-CM | POA: Diagnosis not present

## 2016-06-27 DIAGNOSIS — E8801 Alpha-1-antitrypsin deficiency: Secondary | ICD-10-CM | POA: Diagnosis not present

## 2016-06-27 DIAGNOSIS — E559 Vitamin D deficiency, unspecified: Secondary | ICD-10-CM | POA: Diagnosis not present

## 2016-07-22 DIAGNOSIS — J9691 Respiratory failure, unspecified with hypoxia: Secondary | ICD-10-CM | POA: Diagnosis not present

## 2016-08-07 DIAGNOSIS — G4733 Obstructive sleep apnea (adult) (pediatric): Secondary | ICD-10-CM | POA: Diagnosis not present

## 2016-08-07 DIAGNOSIS — J449 Chronic obstructive pulmonary disease, unspecified: Secondary | ICD-10-CM | POA: Diagnosis not present

## 2016-08-07 DIAGNOSIS — R5383 Other fatigue: Secondary | ICD-10-CM | POA: Diagnosis not present

## 2016-08-07 DIAGNOSIS — G2581 Restless legs syndrome: Secondary | ICD-10-CM | POA: Diagnosis not present

## 2016-08-07 DIAGNOSIS — F17210 Nicotine dependence, cigarettes, uncomplicated: Secondary | ICD-10-CM | POA: Diagnosis not present

## 2016-08-16 DIAGNOSIS — I10 Essential (primary) hypertension: Secondary | ICD-10-CM | POA: Diagnosis not present

## 2016-08-16 DIAGNOSIS — J449 Chronic obstructive pulmonary disease, unspecified: Secondary | ICD-10-CM | POA: Diagnosis not present

## 2016-08-16 DIAGNOSIS — Z1389 Encounter for screening for other disorder: Secondary | ICD-10-CM | POA: Diagnosis not present

## 2016-08-16 DIAGNOSIS — Z8673 Personal history of transient ischemic attack (TIA), and cerebral infarction without residual deficits: Secondary | ICD-10-CM | POA: Diagnosis not present

## 2016-08-22 DIAGNOSIS — J9691 Respiratory failure, unspecified with hypoxia: Secondary | ICD-10-CM | POA: Diagnosis not present

## 2016-09-07 DIAGNOSIS — G4733 Obstructive sleep apnea (adult) (pediatric): Secondary | ICD-10-CM | POA: Diagnosis not present

## 2016-09-20 DIAGNOSIS — J9691 Respiratory failure, unspecified with hypoxia: Secondary | ICD-10-CM | POA: Diagnosis not present

## 2016-09-25 DIAGNOSIS — J449 Chronic obstructive pulmonary disease, unspecified: Secondary | ICD-10-CM | POA: Diagnosis not present

## 2016-09-25 DIAGNOSIS — G2581 Restless legs syndrome: Secondary | ICD-10-CM | POA: Diagnosis not present

## 2016-09-25 DIAGNOSIS — R5383 Other fatigue: Secondary | ICD-10-CM | POA: Diagnosis not present

## 2016-09-25 DIAGNOSIS — G4733 Obstructive sleep apnea (adult) (pediatric): Secondary | ICD-10-CM | POA: Diagnosis not present

## 2016-09-25 DIAGNOSIS — F17210 Nicotine dependence, cigarettes, uncomplicated: Secondary | ICD-10-CM | POA: Diagnosis not present

## 2016-10-12 DIAGNOSIS — G4733 Obstructive sleep apnea (adult) (pediatric): Secondary | ICD-10-CM | POA: Diagnosis not present

## 2016-10-20 DIAGNOSIS — J9691 Respiratory failure, unspecified with hypoxia: Secondary | ICD-10-CM | POA: Diagnosis not present

## 2016-10-30 DIAGNOSIS — J329 Chronic sinusitis, unspecified: Secondary | ICD-10-CM | POA: Diagnosis not present

## 2016-10-30 DIAGNOSIS — Z6835 Body mass index (BMI) 35.0-35.9, adult: Secondary | ICD-10-CM | POA: Diagnosis not present

## 2016-11-20 DIAGNOSIS — J9691 Respiratory failure, unspecified with hypoxia: Secondary | ICD-10-CM | POA: Diagnosis not present

## 2016-12-20 DIAGNOSIS — J9691 Respiratory failure, unspecified with hypoxia: Secondary | ICD-10-CM | POA: Diagnosis not present

## 2017-01-20 DIAGNOSIS — J9691 Respiratory failure, unspecified with hypoxia: Secondary | ICD-10-CM | POA: Diagnosis not present

## 2017-03-05 DIAGNOSIS — Z6835 Body mass index (BMI) 35.0-35.9, adult: Secondary | ICD-10-CM | POA: Diagnosis not present

## 2017-03-05 DIAGNOSIS — Z79899 Other long term (current) drug therapy: Secondary | ICD-10-CM | POA: Diagnosis not present

## 2017-03-05 DIAGNOSIS — Z720 Tobacco use: Secondary | ICD-10-CM | POA: Diagnosis not present

## 2017-03-05 DIAGNOSIS — Z8673 Personal history of transient ischemic attack (TIA), and cerebral infarction without residual deficits: Secondary | ICD-10-CM | POA: Diagnosis not present

## 2017-03-05 DIAGNOSIS — E785 Hyperlipidemia, unspecified: Secondary | ICD-10-CM | POA: Diagnosis not present

## 2017-03-05 DIAGNOSIS — Z0000 Encounter for general adult medical examination without abnormal findings: Secondary | ICD-10-CM | POA: Diagnosis not present

## 2017-03-05 DIAGNOSIS — I10 Essential (primary) hypertension: Secondary | ICD-10-CM | POA: Diagnosis not present

## 2017-03-05 DIAGNOSIS — J449 Chronic obstructive pulmonary disease, unspecified: Secondary | ICD-10-CM | POA: Diagnosis not present

## 2018-01-11 DIAGNOSIS — E785 Hyperlipidemia, unspecified: Secondary | ICD-10-CM | POA: Diagnosis not present

## 2018-01-11 DIAGNOSIS — E7800 Pure hypercholesterolemia, unspecified: Secondary | ICD-10-CM | POA: Diagnosis not present

## 2018-01-11 DIAGNOSIS — J449 Chronic obstructive pulmonary disease, unspecified: Secondary | ICD-10-CM | POA: Diagnosis not present

## 2018-01-11 DIAGNOSIS — N3090 Cystitis, unspecified without hematuria: Secondary | ICD-10-CM | POA: Diagnosis not present

## 2018-01-11 DIAGNOSIS — R319 Hematuria, unspecified: Secondary | ICD-10-CM | POA: Diagnosis not present

## 2018-01-11 DIAGNOSIS — R310 Gross hematuria: Secondary | ICD-10-CM | POA: Diagnosis not present

## 2018-01-11 DIAGNOSIS — I10 Essential (primary) hypertension: Secondary | ICD-10-CM | POA: Diagnosis not present

## 2018-01-11 DIAGNOSIS — N3001 Acute cystitis with hematuria: Secondary | ICD-10-CM | POA: Diagnosis not present

## 2018-01-14 DIAGNOSIS — R310 Gross hematuria: Secondary | ICD-10-CM | POA: Diagnosis not present

## 2018-01-14 DIAGNOSIS — N132 Hydronephrosis with renal and ureteral calculous obstruction: Secondary | ICD-10-CM | POA: Diagnosis not present

## 2018-01-15 DIAGNOSIS — N132 Hydronephrosis with renal and ureteral calculous obstruction: Secondary | ICD-10-CM | POA: Diagnosis not present

## 2018-01-15 DIAGNOSIS — R310 Gross hematuria: Secondary | ICD-10-CM | POA: Diagnosis not present

## 2018-01-15 DIAGNOSIS — N3001 Acute cystitis with hematuria: Secondary | ICD-10-CM | POA: Diagnosis not present

## 2018-01-15 DIAGNOSIS — N201 Calculus of ureter: Secondary | ICD-10-CM | POA: Diagnosis not present

## 2018-01-17 ENCOUNTER — Encounter: Payer: Self-pay | Admitting: Cardiology

## 2018-01-17 ENCOUNTER — Ambulatory Visit (INDEPENDENT_AMBULATORY_CARE_PROVIDER_SITE_OTHER): Payer: PPO | Admitting: Cardiology

## 2018-01-17 ENCOUNTER — Encounter: Payer: Self-pay | Admitting: *Deleted

## 2018-01-17 VITALS — BP 138/76 | HR 68 | Ht 64.0 in | Wt 188.0 lb

## 2018-01-17 DIAGNOSIS — I6930 Unspecified sequelae of cerebral infarction: Secondary | ICD-10-CM | POA: Diagnosis not present

## 2018-01-17 DIAGNOSIS — I252 Old myocardial infarction: Secondary | ICD-10-CM | POA: Diagnosis not present

## 2018-01-17 DIAGNOSIS — I2510 Atherosclerotic heart disease of native coronary artery without angina pectoris: Secondary | ICD-10-CM | POA: Diagnosis not present

## 2018-01-17 DIAGNOSIS — F17200 Nicotine dependence, unspecified, uncomplicated: Secondary | ICD-10-CM

## 2018-01-17 DIAGNOSIS — E785 Hyperlipidemia, unspecified: Secondary | ICD-10-CM | POA: Diagnosis not present

## 2018-01-17 NOTE — Progress Notes (Signed)
Cardiology Consultation:    Date:  01/17/2018   ID:  Martha Mueller, DOB 1956-03-31, MRN [id number]  PCP:  Ronita Hipps, MD  Cardiologist:  Jenne Campus, MD   Referring MD: Ronita Hipps, MD   No chief complaint on file. I need surgery need to be checked before it  History of Present Illness:    Martha Mueller is a 62 y.o. female who is being seen today for the evaluation of coronary artery disease at the request of Ronita Hipps, MD.  Apparently 2005 she did have myocardial infarction.  She ended up getting stent.  She does not know exactly which vessel it was diagnosis: 2 years ago she ended up coming to [facility] with another episode of chest pain she was told to have small heart attack however no intervention was done after that.  She was find to have kidney stone and surgery is contemplated she is here to talk about the risk factors and evaluation of her heart before the surgery.  She denies having any chest pain tightness squeezing pressure burning chest but her ability to exercise is severely limited because of shortness of breath she cannot walk from front to the back of [employer] because of shortness of breath as well as back pain.  There is no swelling of lower extremities no proximal nocturnal dyspnea. She has multiple risk factors for coronary artery disease she still continues to smoke at least 1 pack/day she is been doing this for years, she does have dyslipidemia.  She does have family history of premature coronary artery disease  Past Medical History:  Diagnosis Date  . COPD (chronic obstructive pulmonary disease) ([place])   . Heart attack ([place]) 2005  . Heart disease   . Hyperlipidemia   . Hypertension   . Stroke [facility])     Past Surgical History:  Procedure Laterality Date  . APPENDECTOMY    . CORONARY ANGIOPLASTY WITH STENT PLACEMENT    . TUBAL LIGATION      Current Medications: Current Meds  Medication Sig  . albuterol (PROAIR HFA) 108 (90 BASE)  MCG/ACT inhaler Inhale 2 puffs into the lungs every 6 (six) hours as needed for wheezing or shortness of breath.  Marland Kitchen aspirin 81 MG tablet Take 81 mg by mouth daily.   Marland Kitchen atorvastatin (LIPITOR) 20 MG tablet Take 20 mg by mouth daily.  . citalopram (CELEXA) 20 MG tablet Take 20 mg by mouth daily.  . clopidogrel (PLAVIX) 75 MG tablet Take 1 tablet by mouth daily.  Marland Kitchen diltiazem (CARDIZEM CD) 120 MG 24 hr capsule Take 1 capsule by mouth daily.  Marland Kitchen losartan (COZAAR) 100 MG tablet Take 1 tablet by mouth daily.  . metoprolol succinate (TOPROL-XL) 50 MG 24 hr tablet Take 50 mg by mouth daily. Take with or immediately following a meal.  . nitroGLYCERIN (NITROSTAT) 0.4 MG SL tablet Place 0.4 mg under the tongue every 5 (five) minutes as needed for chest pain.  . ranolazine (RANEXA) 500 MG 12 hr tablet Take 500 mg by mouth 2 (two) times daily.  Marland Kitchen rOPINIRole (REQUIP) 0.5 MG tablet Take 0.5 mg by mouth daily.     Allergies:   Patient has no known allergies.   Social History   Socioeconomic History  . Marital status: Single    Spouse name: Not on file  . Number of children: Not on file  . Years of education: Not on file  . Highest education level: Not on file  Occupational History  . Not on file  Social Needs  . Financial resource strain: Not on file  . Food insecurity:    Worry: Not on file    Inability: Not on file  . Transportation needs:    Medical: Not on file    Non-medical: Not on file  Tobacco Use  . Smoking status: Current Every Day Smoker    Packs/day: 1.00    Years: 50.00    Pack years: 50.00    Types: Cigarettes  . Smokeless tobacco: Never Used  . Tobacco comment: trying to quit 01/06/14  Substance and Sexual Activity  . Alcohol use: Yes    Comment: occ  . Drug use: No  . Sexual activity: Not on file  Lifestyle  . Physical activity:    Days per week: Not on file    Minutes per session: Not on file  . Stress: Not on file  Relationships  . Social connections:    Talks on  phone: Not on file    Gets together: Not on file    Attends religious service: Not on file    Active member of club or organization: Not on file    Attends meetings of clubs or organizations: Not on file    Relationship status: Not on file  Other Topics Concern  . Not on file  Social History Narrative  . Not on file     Family History: The patient's family history includes Emphysema in her mother; Heart disease in her father; Liver disease in her brother; Lung cancer in her mother. ROS:   Please see the history of present illness.    All 14 point review of systems negative except as described per history of present illness.  EKGs/Labs/Other Studies Reviewed:    The following studies were reviewed today:   EKG:  EKG is  ordered today.  The ekg ordered today demonstrates normal sinus rhythm normal P interval poor R wave progression anterior precordium nonspecific ST segment changes  Recent Labs: No results found for requested labs within last 8760 hours.  Recent Lipid Panel No results found for: CHOL, TRIG, HDL, CHOLHDL, VLDL, LDLCALC, LDLDIRECT  Physical Exam:    VS:  BP 138/76 (BP Location: Left Arm, Patient Position: Sitting, Cuff Size: Normal)   Pulse 68   Ht 5\' 4"  (1.626 m)   Wt 188 lb (85.3 kg)   SpO2 97%   BMI 32.27 kg/m     Wt Readings from Last 3 Encounters:  01/17/18 188 lb (85.3 kg)  11/20/14 204 lb (92.5 kg)  01/06/14 204 lb (92.5 kg)     GEN:  Well nourished, well developed in no acute distress HEENT: Normal NECK: No JVD; No carotid bruits LYMPHATICS: No lymphadenopathy CARDIAC: RRR, no murmurs, no rubs, no gallops RESPIRATORY:  Clear to auscultation without rales, wheezing or rhonchi  ABDOMEN: Soft, non-tender, non-distended MUSCULOSKELETAL:  No edema; No deformity  SKIN: Warm and dry NEUROLOGIC:  Alert and oriented x 3 PSYCHIATRIC:  Normal affect   ASSESSMENT:    1. Coronary artery disease involving native coronary artery of native heart  without angina pectoris   2. Old MI (myocardial infarction)   3. Smoker   4. Late effect of cerebrovascular accident (CVA)   5. Dyslipidemia    PLAN:    In order of problems listed above:  1. Coronary artery disease: Apparently microinfarction 2005.  She needs to be evaluated before surgery.  I will ask her to have echocardiogram to assess left  ventricular ejection fraction.  Also to look at the anterior wall since she does have evidence of anterior wall myocardial infarction on echocardiogram.  She is already on aspirin and statin which I will continue.  Is also on beta-blocker which I will continue.  Obviously if echocardiogram which showed diminished ejection fraction we will treat this aggressively.  Supportive evaluation she also need to have a stress test.  We will do Lexiscan. 2. Old myocardial infarction: We will do echocardiogram to assess that 3. Smoking we spent at least 5 minutes talking about this I gave her some hints how to quit.  She will try to do that 4. Late effect of CVA.  Stable 5. Dyslipidemia we will try to contact primary care physician to get her latest test lipid profile.  See her back in my office in about 1 month sooner she has a problem   Medication Adjustments/Labs and Tests Ordered: Current medicines are reviewed at length with the patient today.  Concerns regarding medicines are outlined above.  No orders of the defined types were placed in this encounter.  No orders of the defined types were placed in this encounter.   Signed, Park Liter, MD, [facility]. 01/17/2018 9:01 AM    [place]

## 2018-01-17 NOTE — Patient Instructions (Signed)
Medication Instructions:  Your physician recommends that you continue on your current medications as directed. Please refer to the Current Medication list given to you today.   Labwork: None  Testing/Procedures: You had an EKG today.   Your physician has requested that you have an echocardiogram. Echocardiography is a painless test that uses sound waves to create images of your heart. It provides your doctor with information about the size and shape of your heart and how well your heart's chambers and valves are working. This procedure takes approximately one hour. There are no restrictions for this procedure.  Your physician has requested that you have a lexiscan myoview. For further information please visit [email or web address]. Please follow instruction sheet, as given.   Follow-Up: Your physician recommends that you schedule a follow-up appointment in: 1 month.  If you need a refill on your cardiac medications before your next appointment, please call your pharmacy.   Thank you for choosing [facility]! Robyne Peers, RN [phone]

## 2018-01-28 ENCOUNTER — Telehealth (HOSPITAL_COMMUNITY): Payer: Self-pay | Admitting: *Deleted

## 2018-01-28 DIAGNOSIS — E7800 Pure hypercholesterolemia, unspecified: Secondary | ICD-10-CM | POA: Diagnosis not present

## 2018-01-28 DIAGNOSIS — N200 Calculus of kidney: Secondary | ICD-10-CM | POA: Diagnosis not present

## 2018-01-28 DIAGNOSIS — I10 Essential (primary) hypertension: Secondary | ICD-10-CM | POA: Diagnosis not present

## 2018-01-28 DIAGNOSIS — Z1339 Encounter for screening examination for other mental health and behavioral disorders: Secondary | ICD-10-CM | POA: Diagnosis not present

## 2018-01-28 DIAGNOSIS — Z1331 Encounter for screening for depression: Secondary | ICD-10-CM | POA: Diagnosis not present

## 2018-01-28 DIAGNOSIS — Z6832 Body mass index (BMI) 32.0-32.9, adult: Secondary | ICD-10-CM | POA: Diagnosis not present

## 2018-01-28 NOTE — Telephone Encounter (Signed)
Left message on voicemail per DPR in reference to upcoming appointment scheduled on 01/30/18 at 1000 with detailed instructions given per Myocardial Perfusion Study Information Sheet for the test. LM to arrive 15 minutes early, and that it is imperative to arrive on time for appointment to keep from having the test rescheduled. If you need to cancel or reschedule your appointment, please call the office within 24 hours of your appointment. Failure to do so may result in a cancellation of your appointment, and a $50 no show fee. Phone number given for call back for any questions. Carrie Usery, [place]anae [place]

## 2018-01-29 DIAGNOSIS — N3021 Other chronic cystitis with hematuria: Secondary | ICD-10-CM | POA: Diagnosis not present

## 2018-01-29 DIAGNOSIS — N3090 Cystitis, unspecified without hematuria: Secondary | ICD-10-CM | POA: Diagnosis not present

## 2018-01-29 DIAGNOSIS — N132 Hydronephrosis with renal and ureteral calculous obstruction: Secondary | ICD-10-CM | POA: Diagnosis not present

## 2018-01-29 DIAGNOSIS — R310 Gross hematuria: Secondary | ICD-10-CM | POA: Diagnosis not present

## 2018-01-29 DIAGNOSIS — N201 Calculus of ureter: Secondary | ICD-10-CM | POA: Diagnosis not present

## 2018-01-30 ENCOUNTER — Ambulatory Visit (HOSPITAL_COMMUNITY): Payer: PPO | Attending: Cardiology

## 2018-01-30 ENCOUNTER — Ambulatory Visit (HOSPITAL_BASED_OUTPATIENT_CLINIC_OR_DEPARTMENT_OTHER): Payer: PPO

## 2018-01-30 ENCOUNTER — Other Ambulatory Visit: Payer: Self-pay

## 2018-01-30 VITALS — Ht 64.0 in | Wt 188.0 lb

## 2018-01-30 DIAGNOSIS — I252 Old myocardial infarction: Secondary | ICD-10-CM

## 2018-01-30 DIAGNOSIS — E785 Hyperlipidemia, unspecified: Secondary | ICD-10-CM | POA: Insufficient documentation

## 2018-01-30 DIAGNOSIS — F17200 Nicotine dependence, unspecified, uncomplicated: Secondary | ICD-10-CM | POA: Insufficient documentation

## 2018-01-30 DIAGNOSIS — I2510 Atherosclerotic heart disease of native coronary artery without angina pectoris: Secondary | ICD-10-CM

## 2018-01-30 DIAGNOSIS — J449 Chronic obstructive pulmonary disease, unspecified: Secondary | ICD-10-CM | POA: Diagnosis not present

## 2018-01-30 DIAGNOSIS — Z8673 Personal history of transient ischemic attack (TIA), and cerebral infarction without residual deficits: Secondary | ICD-10-CM | POA: Insufficient documentation

## 2018-01-30 DIAGNOSIS — I10 Essential (primary) hypertension: Secondary | ICD-10-CM | POA: Insufficient documentation

## 2018-01-30 LAB — MYOCARDIAL PERFUSION IMAGING
CHL CUP NUCLEAR SRS: 30
CHL CUP RESTING HR STRESS: 62 {beats}/min
LHR: 0.35
LV dias vol: 125 mL (ref 46–106)
LV sys vol: 76 mL
Peak HR: 94 {beats}/min
SDS: 5
SSS: 35
TID: 1.06

## 2018-01-30 MED ORDER — PERFLUTREN LIPID MICROSPHERE
1.0000 mL | INTRAVENOUS | Status: AC | PRN
  Administered 2018-01-30: 2 mL via INTRAVENOUS

## 2018-01-30 MED ORDER — REGADENOSON 0.4 MG/5ML IV SOLN
0.4000 mg | Freq: Once | INTRAVENOUS | Status: AC
  Administered 2018-01-30: 0.4 mg via INTRAVENOUS

## 2018-01-30 MED ORDER — TECHNETIUM TC 99M TETROFOSMIN IV KIT
10.9000 | PACK | Freq: Once | INTRAVENOUS | Status: AC | PRN
  Administered 2018-01-30: 10.9 via INTRAVENOUS
  Filled 2018-01-30: qty 11

## 2018-01-30 MED ORDER — TECHNETIUM TC 99M TETROFOSMIN IV KIT
32.7000 | PACK | Freq: Once | INTRAVENOUS | Status: AC | PRN
Start: 2018-01-30 — End: 2018-01-30
  Administered 2018-01-30: 32.7 via INTRAVENOUS
  Filled 2018-01-30: qty 33

## 2018-02-01 ENCOUNTER — Telehealth: Payer: Self-pay

## 2018-02-01 ENCOUNTER — Ambulatory Visit (INDEPENDENT_AMBULATORY_CARE_PROVIDER_SITE_OTHER): Payer: PPO | Admitting: Cardiology

## 2018-02-01 ENCOUNTER — Encounter: Payer: Self-pay | Admitting: Cardiology

## 2018-02-01 VITALS — BP 142/76 | HR 74 | Ht 64.0 in | Wt 186.0 lb

## 2018-02-01 DIAGNOSIS — I2510 Atherosclerotic heart disease of native coronary artery without angina pectoris: Secondary | ICD-10-CM | POA: Diagnosis not present

## 2018-02-01 DIAGNOSIS — F17200 Nicotine dependence, unspecified, uncomplicated: Secondary | ICD-10-CM

## 2018-02-01 DIAGNOSIS — I255 Ischemic cardiomyopathy: Secondary | ICD-10-CM

## 2018-02-01 DIAGNOSIS — E785 Hyperlipidemia, unspecified: Secondary | ICD-10-CM

## 2018-02-01 NOTE — Telephone Encounter (Signed)
Informed patient that she would need to come in sooner rather than later to discuss results of cardiac stress test. Patient was understanding. Will have the front desk call to schedule.

## 2018-02-01 NOTE — Patient Instructions (Signed)
Medication Instructions:  Your physician recommends that you continue on your current medications as directed. Please refer to the Current Medication list given to you today.  Labwork: None  Testing/Procedures: None  Follow-Up: Your physician recommends that you schedule a follow-up appointment in: 1 months  Any Other Special Instructions Will Be Listed Below (If Applicable).     If you need a refill on your cardiac medications before your next appointment, please call your pharmacy.   [place], RN, BSN 2

## 2018-02-01 NOTE — Progress Notes (Signed)
Cardiology Office Note:    Date:  02/01/2018   ID:  Martha Mueller, DOB 02-22-1956, MRN [id number]  PCP:  Ronita Hipps, MD  Cardiologist:  Jenne Campus, MD    Referring MD: Ronita Hipps, MD   No chief complaint on file. Doing well but still short of breath  History of Present Illness:    Martha Mueller is a 62 y.o. female with nephrolithiasis.  She was referred to me originally to be evaluated before surgery for her kidney stones.  She does have past medical history significant for peripheral vascular disease as well as coronary artery disease.  Evaluation that has been performed involved echocardiogram which showed diminished left ventricular ejection fraction as well as stress test.  Stress test showed some old microinfarction involving apex however there was small area of peri-infarct ischemia around the myocardial infarction.  Overall she is asymptomatic she does not have any chest pain but it looks like the biggest issue that she have is her COPD was quite advanced.  She still continue to smoke.  With talking length about what to do with the situation.  First of all I need to find out exactly what kind of surgery she is going to have and what kind of anesthesia is planned to be used.  If we talked about general anesthesia that she may require even cardiac catheterization to future clarified degree of her coronary artery disease.  In the meantime I will continue with present medication which include Plavix aspirin as well as statin.  Past Medical History:  Diagnosis Date  . COPD (chronic obstructive pulmonary disease) ([place])   . Heart attack ([place]) 2005  . Heart disease   . Hyperlipidemia   . Hypertension   . Stroke [facility])     Past Surgical History:  Procedure Laterality Date  . APPENDECTOMY    . CORONARY ANGIOPLASTY WITH STENT PLACEMENT    . TUBAL LIGATION      Current Medications: Current Meds  Medication Sig  . albuterol (PROAIR HFA) 108 (90 BASE) MCG/ACT inhaler  Inhale 2 puffs into the lungs every 6 (six) hours as needed for wheezing or shortness of breath.  Marland Kitchen atorvastatin (LIPITOR) 20 MG tablet Take 20 mg by mouth daily.  Marland Kitchen buPROPion (WELLBUTRIN XL) 150 MG 24 hr tablet Take 1 tablet by mouth daily.  . citalopram (CELEXA) 20 MG tablet Take 20 mg by mouth daily.  Marland Kitchen diltiazem (CARDIZEM CD) 120 MG 24 hr capsule Take 1 capsule by mouth daily.  Marland Kitchen losartan (COZAAR) 100 MG tablet Take 1 tablet by mouth daily.  . metoprolol succinate (TOPROL-XL) 50 MG 24 hr tablet Take 50 mg by mouth daily. Take with or immediately following a meal.  . nitroGLYCERIN (NITROSTAT) 0.4 MG SL tablet Place 0.4 mg under the tongue every 5 (five) minutes as needed for chest pain.  . ranolazine (RANEXA) 500 MG 12 hr tablet Take 500 mg by mouth 2 (two) times daily.  Marland Kitchen rOPINIRole (REQUIP) 0.5 MG tablet Take 0.5 mg by mouth daily.     Allergies:   Patient has no known allergies.   Social History   Socioeconomic History  . Marital status: Single    Spouse name: Not on file  . Number of children: Not on file  . Years of education: Not on file  . Highest education level: Not on file  Occupational History  . Not on file  Social Needs  . Financial resource strain: Not on file  . Food  insecurity:    Worry: Not on file    Inability: Not on file  . Transportation needs:    Medical: Not on file    Non-medical: Not on file  Tobacco Use  . Smoking status: Current Every Day Smoker    Packs/day: 1.00    Years: 50.00    Pack years: 50.00    Types: Cigarettes  . Smokeless tobacco: Never Used  . Tobacco comment: trying to quit 01/06/14  Substance and Sexual Activity  . Alcohol use: Yes    Comment: occ  . Drug use: No  . Sexual activity: Not on file  Lifestyle  . Physical activity:    Days per week: Not on file    Minutes per session: Not on file  . Stress: Not on file  Relationships  . Social connections:    Talks on phone: Not on file    Gets together: Not on file     Attends religious service: Not on file    Active member of club or organization: Not on file    Attends meetings of clubs or organizations: Not on file    Relationship status: Not on file  Other Topics Concern  . Not on file  Social History Narrative  . Not on file     Family History: The patient's family history includes Emphysema in her mother; Heart disease in her father; Liver disease in her brother; Lung cancer in her mother. ROS:   Please see the history of present illness.    All 14 point review of systems negative except as described per history of present illness  EKGs/Labs/Other Studies Reviewed:      Recent Labs: No results found for requested labs within last 8760 hours.  Recent Lipid Panel No results found for: CHOL, TRIG, HDL, CHOLHDL, VLDL, LDLCALC, LDLDIRECT  Physical Exam:    VS:  BP (!) 142/76 (BP Location: Right Arm, Patient Position: Sitting, Cuff Size: Normal)   Pulse 74   Ht 5\' 4"  (1.626 m)   Wt 186 lb (84.4 kg)   SpO2 96%   BMI 31.93 kg/m     Wt Readings from Last 3 Encounters:  02/01/18 186 lb (84.4 kg)  01/30/18 188 lb (85.3 kg)  01/17/18 188 lb (85.3 kg)     GEN:  Well nourished, well developed in no acute distress HEENT: Normal NECK: No JVD; No carotid bruits LYMPHATICS: No lymphadenopathy CARDIAC: RRR, no murmurs, no rubs, no gallops RESPIRATORY:  Clear to auscultation without rales, wheezing or rhonchi poor air entry bilaterally ABDOMEN: Soft, non-tender, non-distended MUSCULOSKELETAL:  No edema; No deformity  SKIN: Warm and dry LOWER EXTREMITIES: no swelling NEUROLOGIC:  Alert and oriented x 3 PSYCHIATRIC:  Normal affect   ASSESSMENT:    1. Coronary artery disease involving native coronary artery of native heart without angina pectoris   2. Ischemic cardiomyopathy   3. Dyslipidemia   4. Smoker    PLAN:    In order of problems listed above:  1. Coronary artery disease: Plan as outlined above.  Recent stress testing showed  wall microinfarction with peri-infarct ischemia.  She appears to be asymptomatic therefore should be fine to be managed only medically however if she is being planned for surgery and the need to find out exactly what surgery would like to do. 2. Ischemic cardiomyopathy the future we will probably switch her to [facility] in the meantime she is on ARB as well as beta-blocker that likely she is able to tolerate which I  will continue. 3. Dyslipidemia will call primary care physician to get her fasting lipid profile and then I suspect I will be forced to augment her lipid-lowering therapy. 4. Smoking obviously was spent great of time talking about this issue and I strongly recommended to quit.   Medication Adjustments/Labs and Tests Ordered: Current medicines are reviewed at length with the patient today.  Concerns regarding medicines are outlined above.  No orders of the defined types were placed in this encounter.  Medication changes: No orders of the defined types were placed in this encounter.   Signed, Park Liter, MD, [facility] 02/01/2018 12:39 PM    [place]

## 2018-02-05 ENCOUNTER — Encounter: Payer: Self-pay | Admitting: Cardiology

## 2018-02-05 DIAGNOSIS — N3021 Other chronic cystitis with hematuria: Secondary | ICD-10-CM | POA: Diagnosis not present

## 2018-02-05 DIAGNOSIS — R310 Gross hematuria: Secondary | ICD-10-CM | POA: Diagnosis not present

## 2018-02-05 DIAGNOSIS — N1330 Unspecified hydronephrosis: Secondary | ICD-10-CM | POA: Diagnosis not present

## 2018-02-05 DIAGNOSIS — N201 Calculus of ureter: Secondary | ICD-10-CM | POA: Diagnosis not present

## 2018-02-07 DIAGNOSIS — Z7982 Long term (current) use of aspirin: Secondary | ICD-10-CM | POA: Diagnosis not present

## 2018-02-07 DIAGNOSIS — N3021 Other chronic cystitis with hematuria: Secondary | ICD-10-CM | POA: Diagnosis not present

## 2018-02-07 DIAGNOSIS — E119 Type 2 diabetes mellitus without complications: Secondary | ICD-10-CM | POA: Diagnosis not present

## 2018-02-07 DIAGNOSIS — I2510 Atherosclerotic heart disease of native coronary artery without angina pectoris: Secondary | ICD-10-CM | POA: Diagnosis not present

## 2018-02-07 DIAGNOSIS — N201 Calculus of ureter: Secondary | ICD-10-CM | POA: Diagnosis not present

## 2018-02-07 DIAGNOSIS — Z955 Presence of coronary angioplasty implant and graft: Secondary | ICD-10-CM | POA: Diagnosis not present

## 2018-02-07 DIAGNOSIS — I10 Essential (primary) hypertension: Secondary | ICD-10-CM | POA: Diagnosis not present

## 2018-02-07 DIAGNOSIS — I252 Old myocardial infarction: Secondary | ICD-10-CM | POA: Diagnosis not present

## 2018-02-07 DIAGNOSIS — Z79899 Other long term (current) drug therapy: Secondary | ICD-10-CM | POA: Diagnosis not present

## 2018-02-07 DIAGNOSIS — Z7902 Long term (current) use of antithrombotics/antiplatelets: Secondary | ICD-10-CM | POA: Diagnosis not present

## 2018-02-07 DIAGNOSIS — N1330 Unspecified hydronephrosis: Secondary | ICD-10-CM | POA: Diagnosis not present

## 2018-02-07 DIAGNOSIS — Z8673 Personal history of transient ischemic attack (TIA), and cerebral infarction without residual deficits: Secondary | ICD-10-CM | POA: Diagnosis not present

## 2018-02-07 DIAGNOSIS — E7800 Pure hypercholesterolemia, unspecified: Secondary | ICD-10-CM | POA: Diagnosis not present

## 2018-02-07 DIAGNOSIS — J449 Chronic obstructive pulmonary disease, unspecified: Secondary | ICD-10-CM | POA: Diagnosis not present

## 2018-02-07 DIAGNOSIS — N132 Hydronephrosis with renal and ureteral calculous obstruction: Secondary | ICD-10-CM | POA: Diagnosis not present

## 2018-02-07 DIAGNOSIS — F17210 Nicotine dependence, cigarettes, uncomplicated: Secondary | ICD-10-CM | POA: Diagnosis not present

## 2018-02-07 DIAGNOSIS — R310 Gross hematuria: Secondary | ICD-10-CM | POA: Diagnosis not present

## 2018-02-08 ENCOUNTER — Other Ambulatory Visit: Payer: Self-pay

## 2018-02-08 NOTE — Patient Outreach (Signed)
[facility]) Care Management  02/08/2018  Martha Mueller [birth year] [id number]   Transition of care  Referral date: 02/08/18 Referral source: discharged from an inpatient admission from [facility] on 02/07/18 Insurance: [employer]  Transition of care will be completed by primary care provider office who will refer to [facility] care management if needed.   PLAN: RNCM will close patient due to patient being enrolled in an external program.   Quinn Plowman RN,BSN,CCM [facility] Telephonic  [phone]

## 2018-02-15 DIAGNOSIS — N3020 Other chronic cystitis without hematuria: Secondary | ICD-10-CM | POA: Diagnosis not present

## 2018-02-15 DIAGNOSIS — N201 Calculus of ureter: Secondary | ICD-10-CM | POA: Diagnosis not present

## 2018-02-20 ENCOUNTER — Ambulatory Visit: Payer: PPO | Admitting: Cardiology

## 2018-03-01 DIAGNOSIS — N3020 Other chronic cystitis without hematuria: Secondary | ICD-10-CM | POA: Diagnosis not present

## 2018-03-01 DIAGNOSIS — N201 Calculus of ureter: Secondary | ICD-10-CM | POA: Diagnosis not present

## 2018-03-05 ENCOUNTER — Encounter: Payer: Self-pay | Admitting: Cardiology

## 2018-03-05 ENCOUNTER — Ambulatory Visit (INDEPENDENT_AMBULATORY_CARE_PROVIDER_SITE_OTHER): Payer: PPO | Admitting: Cardiology

## 2018-03-05 VITALS — BP 160/80 | HR 87 | Ht 64.0 in | Wt 185.0 lb

## 2018-03-05 DIAGNOSIS — M79605 Pain in left leg: Secondary | ICD-10-CM

## 2018-03-05 DIAGNOSIS — E785 Hyperlipidemia, unspecified: Secondary | ICD-10-CM | POA: Diagnosis not present

## 2018-03-05 DIAGNOSIS — I255 Ischemic cardiomyopathy: Secondary | ICD-10-CM | POA: Diagnosis not present

## 2018-03-05 DIAGNOSIS — M79604 Pain in right leg: Secondary | ICD-10-CM | POA: Diagnosis not present

## 2018-03-05 DIAGNOSIS — I10 Essential (primary) hypertension: Secondary | ICD-10-CM

## 2018-03-05 DIAGNOSIS — I2510 Atherosclerotic heart disease of native coronary artery without angina pectoris: Secondary | ICD-10-CM

## 2018-03-05 MED ORDER — SACUBITRIL-VALSARTAN 24-26 MG PO TABS: 1.0000 | ORAL_TABLET | Freq: Two times a day (BID) | ORAL | 2 refills | Status: DC

## 2018-03-05 NOTE — Progress Notes (Signed)
Cardiology Office Note:    Date:  03/05/2018   ID:  Martha Mueller, DOB 01/18/1956, MRN [id number]  PCP:  Ronita Hipps, MD  Cardiologist:  Jenne Campus, MD    Referring MD: Ronita Hipps, MD   Chief Complaint  Patient presents with  . Follow-up    1 month  Doing well  History of Present Illness:    Martha Mueller is a 62 y.o. female with coronary artery disease cardiomyopathy with mildly diminished left ventricular ejection fraction.  Also kidney stones.  Recent procedure done which was successful to remove kidney stones.  Denies having any chest pain tightness squeezing pressure burning chest does have shortness of breath which is probably multifactorial.  Still continues to smoke.  Also complained of having some leg pain while walking.  Past Medical History:  Diagnosis Date  . COPD (chronic obstructive pulmonary disease) ([place])   . Heart attack ([place]) 2005  . Heart disease   . Hyperlipidemia   . Hypertension   . Stroke [facility])     Past Surgical History:  Procedure Laterality Date  . APPENDECTOMY    . CORONARY ANGIOPLASTY WITH STENT PLACEMENT    . TUBAL LIGATION      Current Medications: Current Meds  Medication Sig  . albuterol (PROAIR HFA) 108 (90 BASE) MCG/ACT inhaler Inhale 2 puffs into the lungs every 6 (six) hours as needed for wheezing or shortness of breath.  Marland Kitchen atorvastatin (LIPITOR) 20 MG tablet Take 20 mg by mouth daily.  Marland Kitchen buPROPion (WELLBUTRIN XL) 150 MG 24 hr tablet Take 1 tablet by mouth daily.  . citalopram (CELEXA) 20 MG tablet Take 20 mg by mouth daily.  Marland Kitchen diltiazem (CARDIZEM CD) 120 MG 24 hr capsule Take 1 capsule by mouth daily.  . Fluticasone-Umeclidin-Vilant (TRELEGY ELLIPTA) 100-62.5-25 MCG/INH AEPB Inhale 1 puff into the lungs daily.  Marland Kitchen losartan (COZAAR) 100 MG tablet Take 1 tablet by mouth daily.  . metoprolol succinate (TOPROL-XL) 50 MG 24 hr tablet Take 50 mg by mouth daily. Take with or immediately following a meal.  . nitroGLYCERIN  (NITROSTAT) 0.4 MG SL tablet Place 0.4 mg under the tongue every 5 (five) minutes as needed for chest pain.  . ranolazine (RANEXA) 500 MG 12 hr tablet Take 500 mg by mouth 2 (two) times daily.  Marland Kitchen rOPINIRole (REQUIP) 0.5 MG tablet Take 0.5 mg by mouth daily.     Allergies:   Patient has no known allergies.   Social History   Socioeconomic History  . Marital status: Single    Spouse name: Not on file  . Number of children: Not on file  . Years of education: Not on file  . Highest education level: Not on file  Occupational History  . Not on file  Social Needs  . Financial resource strain: Not on file  . Food insecurity:    Worry: Not on file    Inability: Not on file  . Transportation needs:    Medical: Not on file    Non-medical: Not on file  Tobacco Use  . Smoking status: Current Every Day Smoker    Packs/day: 1.00    Years: 50.00    Pack years: 50.00    Types: Cigarettes  . Smokeless tobacco: Never Used  Substance and Sexual Activity  . Alcohol use: Yes    Comment: occ  . Drug use: No  . Sexual activity: Not on file  Lifestyle  . Physical activity:    Days per week:  Not on file    Minutes per session: Not on file  . Stress: Not on file  Relationships  . Social connections:    Talks on phone: Not on file    Gets together: Not on file    Attends religious service: Not on file    Active member of club or organization: Not on file    Attends meetings of clubs or organizations: Not on file    Relationship status: Not on file  Other Topics Concern  . Not on file  Social History Narrative  . Not on file     Family History: The patient's family history includes Emphysema in her mother; Heart disease in her father; Liver disease in her brother; Lung cancer in her mother. ROS:   Please see the history of present illness.    All 14 point review of systems negative except as described per history of present illness  EKGs/Labs/Other Studies Reviewed:      Recent  Labs: No results found for requested labs within last 8760 hours.  Recent Lipid Panel No results found for: CHOL, TRIG, HDL, CHOLHDL, VLDL, LDLCALC, LDLDIRECT  Physical Exam:    VS:  BP (!) 160/80 (BP Location: Right Arm, Patient Position: Sitting, Cuff Size: Normal)   Pulse 87   Ht 5\' 4"  (1.626 m)   Wt 185 lb (83.9 kg)   SpO2 95%   BMI 31.76 kg/m     Wt Readings from Last 3 Encounters:  03/05/18 185 lb (83.9 kg)  02/01/18 186 lb (84.4 kg)  01/30/18 188 lb (85.3 kg)     GEN:  Well nourished, well developed in no acute distress HEENT: Normal NECK: No JVD; No carotid bruits LYMPHATICS: No lymphadenopathy CARDIAC: RRR, no murmurs, no rubs, no gallops RESPIRATORY:  Clear to auscultation without rales, wheezing or rhonchi  ABDOMEN: Soft, non-tender, non-distended MUSCULOSKELETAL:  No edema; No deformity  SKIN: Warm and dry LOWER EXTREMITIES: no swelling NEUROLOGIC:  Alert and oriented x 3 PSYCHIATRIC:  Normal affect   ASSESSMENT:    No diagnosis found. PLAN:    In order of problems listed above:  1. Coronary artery disease.  Stable on appropriate medications which I will continue. 2. Cardiomyopathy which is ischemic I will switch her from Cozaar to [place] twice daily.  Chem-7 will be done in 1 week. 3. COPD: Related to smoking still continue to smoke of course she was advised to quit. 4. Dyslipidemia followed by internal medicine team will call the office to get a copy of her fasting lipid profile. 5. Pain in lower extremities while walking will do arterial duplex of lower extremities.  See her back in 3 months or sooner   Medication Adjustments/Labs and Tests Ordered: Current medicines are reviewed at length with the patient today.  Concerns regarding medicines are outlined above.  No orders of the defined types were placed in this encounter.  Medication changes: No orders of the defined types were placed in this encounter.   Signed, Park Liter, MD,  [facility] 03/05/2018 8:38 AM    [place]

## 2018-03-05 NOTE — Patient Instructions (Addendum)
Medication Instructions:  Your physician has recommended you make the following change in your medication:   START: Entresto 24/26 mg twice daily.   STOP: Cozar (losartan)   Labwork: Your physician recommends that you return for lab work in 1 week: BMP   Testing/Procedures: Your physician has requested that you have a lower or upper extremity arterial duplex. This test is an ultrasound of the arteries in the legs or arms. It looks at arterial blood flow in the legs and arms. Allow one hour for Lower and Upper Arterial scans. There are no restrictions or special instructions   Follow-Up: Your physician wants you to follow-up in: 3 months. You will receive a reminder letter in the mail two months in advance. If you don't receive a letter, please call our office to schedule the follow-up appointment.   Any Other Special Instructions Will Be Listed Below (If Applicable).     If you need a refill on your cardiac medications before your next appointment, please call your pharmacy.  Sacubitril; Valsartan oral tablet What is this medicine? SACUBITRIL; VALSARTAN (sak UE bi tril; val SAR tan) is a combination of 2 drugs used to reduce the risk of death and hospitalizations in people with long-lasting heart failure. It is usually used with other medicines to treat heart failure. This medicine may be used for other purposes; ask your health care provider or pharmacist if you have questions. COMMON BRAND NAME(S): Entresto What should I tell my health care provider before I take this medicine? They need to know if you have any of these conditions: -diabetes and take a medicine that contains aliskiren -kidney disease -liver disease -an unusual or allergic reaction to sacubitril; valsartan, drugs called angiotensin converting enzyme (ACE) inhibitors, angiotensin II receptor blockers (ARBs), other medicines, foods, dyes, or preservatives -pregnant or trying to get pregnant -breast-feeding How  should I use this medicine? Take this medicine by mouth with a glass of water. Follow the directions on the prescription label. You can take it with or without food. If it upsets your stomach, take it with food. Take your medicine at regular intervals. Do not take it more often than directed. Do not stop taking except on your doctor's advice. Do not take this medicine for at least 36 hours before or after you take an ACE inhibitor medicine. Talk to your health care provider if you are not sure if you take an ACE inhibitor. Talk to your pediatrician regarding the use of this medicine in children. Special care may be needed. Overdosage: If you think you have taken too much of this medicine contact a poison control center or emergency room at once. NOTE: This medicine is only for you. Do not share this medicine with others. What if I miss a dose? If you miss a dose, take it as soon as you can. If it is almost time for next dose, take only that dose. Do not take double or extra doses. What may interact with this medicine? Do not take this medicine with any of the following medicines: -aliskiren if you have diabetes -angiotensin-converting enzyme (ACE) inhibitors, like benazepril, captopril, enalapril, fosinopril, lisinopril, or ramipril This medicine may also interact with the following medicines: -angiotensin II receptor blockers (ARBs) like azilsartan, candesartan, eprosartan, irbesartan, losartan, olmesartan, telmisartan, or valsartan -lithium -NSAIDS, medicines for pain and inflammation, like ibuprofen or naproxen -potassium-sparing diuretics like amiloride, spironolactone, and triamterene -potassium supplements This list may not describe all possible interactions. Give your health care provider a list of  all the medicines, herbs, non-prescription drugs, or dietary supplements you use. Also tell them if you smoke, drink alcohol, or use illegal drugs. Some items may interact with your  medicine. What should I watch for while using this medicine? Tell your doctor or healthcare professional if your symptoms do not start to get better or if they get worse. Do not become pregnant while taking this medicine. Women should inform their doctor if they wish to become pregnant or think they might be pregnant. There is a potential for serious side effects to an unborn child. Talk to your health care professional or pharmacist for more information. You may get dizzy. Do not drive, use machinery, or do anything that needs mental alertness until you know how this medicine affects you. Do not stand or sit up quickly, especially if you are an older patient. This reduces the risk of dizzy or fainting spells. Avoid alcoholic drinks; they can make you more dizzy. What side effects may I notice from receiving this medicine? Side effects that you should report to your doctor or health care professional as soon as possible: -allergic reactions like skin rash, itching or hives, swelling of the face, lips, or tongue -signs and symptoms of increased potassium like muscle weakness; chest pain; or fast, irregular heartbeat -signs and symptoms of kidney injury like trouble passing urine or change in the amount of urine -signs and symptoms of low blood pressure like feeling dizzy or lightheaded, or if you develop extreme fatigue Side effects that usually do not require medical attention (report to your doctor or health care professional if they continue or are bothersome): -cough This list may not describe all possible side effects. Call your doctor for medical advice about side effects. You may report side effects to FDA at 1-800-FDA-1088. Where should I keep my medicine? Keep out of the reach of children. Store at room temperature between 15 and 30 degrees C (59 and 86 degrees F). Throw away any unused medicine after the expiration date. NOTE: This sheet is a summary. It may not cover all possible  information. If you have questions about this medicine, talk to your doctor, pharmacist, or health care provider.  2018 Elsevier/Gold Standard (2015-08-25 13:54:19)

## 2018-03-07 ENCOUNTER — Other Ambulatory Visit: Payer: Self-pay

## 2018-03-12 DIAGNOSIS — I10 Essential (primary) hypertension: Secondary | ICD-10-CM | POA: Diagnosis not present

## 2018-03-12 LAB — BASIC METABOLIC PANEL
BUN / CREAT RATIO: 12 (ref 12–28)
BUN: 11 mg/dL (ref 8–27)
CO2: 23 mmol/L (ref 20–29)
CREATININE: 0.92 mg/dL (ref 0.57–1.00)
Calcium: 9.4 mg/dL (ref 8.7–10.3)
Chloride: 98 mmol/L (ref 96–106)
GFR, EST AFRICAN AMERICAN: 77 mL/min/{1.73_m2} (ref >59)
GFR, EST NON AFRICAN AMERICAN: 66 mL/min/{1.73_m2} (ref >59)
GLUCOSE: 90 mg/dL (ref 65–99)
Potassium: 4.2 mmol/L (ref 3.5–5.2)
SODIUM: 138 mmol/L (ref 134–144)

## 2018-03-15 ENCOUNTER — Telehealth: Payer: Self-pay

## 2018-03-15 NOTE — Telephone Encounter (Signed)
-----   Message from Park Liter, MD sent at 03/14/2018  8:44 PM EDT ----- Labs are good, cont same management

## 2018-03-15 NOTE — Telephone Encounter (Signed)
Patient was called and notified of results. 

## 2018-04-01 DIAGNOSIS — I509 Heart failure, unspecified: Secondary | ICD-10-CM | POA: Diagnosis not present

## 2018-04-01 DIAGNOSIS — I110 Hypertensive heart disease with heart failure: Secondary | ICD-10-CM | POA: Diagnosis not present

## 2018-04-01 DIAGNOSIS — F17210 Nicotine dependence, cigarettes, uncomplicated: Secondary | ICD-10-CM | POA: Diagnosis not present

## 2018-04-01 DIAGNOSIS — I252 Old myocardial infarction: Secondary | ICD-10-CM | POA: Diagnosis not present

## 2018-04-01 DIAGNOSIS — J449 Chronic obstructive pulmonary disease, unspecified: Secondary | ICD-10-CM | POA: Diagnosis not present

## 2018-04-01 DIAGNOSIS — Z8673 Personal history of transient ischemic attack (TIA), and cerebral infarction without residual deficits: Secondary | ICD-10-CM | POA: Diagnosis not present

## 2018-04-01 DIAGNOSIS — R040 Epistaxis: Secondary | ICD-10-CM | POA: Diagnosis not present

## 2018-04-03 DIAGNOSIS — J342 Deviated nasal septum: Secondary | ICD-10-CM | POA: Diagnosis not present

## 2018-04-03 DIAGNOSIS — Z0000 Encounter for general adult medical examination without abnormal findings: Secondary | ICD-10-CM | POA: Diagnosis not present

## 2018-04-03 DIAGNOSIS — Z6832 Body mass index (BMI) 32.0-32.9, adult: Secondary | ICD-10-CM | POA: Diagnosis not present

## 2018-04-03 DIAGNOSIS — R040 Epistaxis: Secondary | ICD-10-CM | POA: Diagnosis not present

## 2018-04-03 DIAGNOSIS — Z7902 Long term (current) use of antithrombotics/antiplatelets: Secondary | ICD-10-CM | POA: Diagnosis not present

## 2018-04-03 DIAGNOSIS — Z7982 Long term (current) use of aspirin: Secondary | ICD-10-CM | POA: Diagnosis not present

## 2018-04-05 DIAGNOSIS — R040 Epistaxis: Secondary | ICD-10-CM | POA: Diagnosis not present

## 2018-04-05 DIAGNOSIS — J342 Deviated nasal septum: Secondary | ICD-10-CM | POA: Diagnosis not present

## 2018-04-05 DIAGNOSIS — Z7982 Long term (current) use of aspirin: Secondary | ICD-10-CM | POA: Diagnosis not present

## 2018-04-05 DIAGNOSIS — Z7902 Long term (current) use of antithrombotics/antiplatelets: Secondary | ICD-10-CM | POA: Diagnosis not present

## 2018-04-07 DIAGNOSIS — R040 Epistaxis: Secondary | ICD-10-CM | POA: Diagnosis not present

## 2018-04-07 DIAGNOSIS — R0902 Hypoxemia: Secondary | ICD-10-CM | POA: Diagnosis not present

## 2018-04-07 DIAGNOSIS — R58 Hemorrhage, not elsewhere classified: Secondary | ICD-10-CM | POA: Diagnosis not present

## 2018-04-08 ENCOUNTER — Other Ambulatory Visit: Payer: Self-pay

## 2018-04-08 NOTE — Patient Outreach (Signed)
[facility]) Care Management  04/08/2018  Martha Mueller 1955-12-18 [id number]   Referral Date: 04/08/18 Referral Source: [facility] Referral Reason: Nosebleeds   Outreach Attempt: spoke with patient.  She is able to verify HIPAA.  Discussed reason for call. Patient reports that she ended up going to the emergency room for her nosebleed.  She states that they got the bleeding to stop and gave her ointment and saline spray to use.  She states that she has used those things and her nose has not bled today.  She is hoping that if she continued to do that she will not have any more nosebleeds.  She states she has been in contact with the ENT and has an appointment in two weeks.  She states she was advised by them if it happens again to contact her PCP or go to the ED if she cannot get the bleeding to stop.  Patient declined any further needs or concerns at this time.  Discussed [facility] services.  Patient declined need at this time and no further needs assessed.     Plan: RN CM will close case.   Jone Baseman, RN, MSN [facility] Care Management Care Management Coordinator Direct Line [phone] Toll Free: [phone]  Fax: [phone]

## 2018-04-30 DIAGNOSIS — Z6832 Body mass index (BMI) 32.0-32.9, adult: Secondary | ICD-10-CM | POA: Diagnosis not present

## 2018-04-30 DIAGNOSIS — Z23 Encounter for immunization: Secondary | ICD-10-CM | POA: Diagnosis not present

## 2018-04-30 DIAGNOSIS — D500 Iron deficiency anemia secondary to blood loss (chronic): Secondary | ICD-10-CM | POA: Diagnosis not present

## 2018-05-23 DIAGNOSIS — E785 Hyperlipidemia, unspecified: Secondary | ICD-10-CM | POA: Diagnosis not present

## 2018-05-23 DIAGNOSIS — I10 Essential (primary) hypertension: Secondary | ICD-10-CM | POA: Diagnosis not present

## 2018-06-03 DIAGNOSIS — N3020 Other chronic cystitis without hematuria: Secondary | ICD-10-CM | POA: Diagnosis not present

## 2018-06-03 DIAGNOSIS — N201 Calculus of ureter: Secondary | ICD-10-CM | POA: Diagnosis not present

## 2018-06-06 ENCOUNTER — Other Ambulatory Visit: Payer: Self-pay | Admitting: Cardiology

## 2018-06-06 ENCOUNTER — Ambulatory Visit: Payer: PPO | Admitting: Cardiology

## 2018-06-06 ENCOUNTER — Encounter: Payer: Self-pay | Admitting: Cardiology

## 2018-06-06 VITALS — BP 140/80 | HR 79 | Ht 64.0 in | Wt 192.6 lb

## 2018-06-06 DIAGNOSIS — I255 Ischemic cardiomyopathy: Secondary | ICD-10-CM | POA: Diagnosis not present

## 2018-06-06 DIAGNOSIS — F17200 Nicotine dependence, unspecified, uncomplicated: Secondary | ICD-10-CM | POA: Diagnosis not present

## 2018-06-06 DIAGNOSIS — E785 Hyperlipidemia, unspecified: Secondary | ICD-10-CM

## 2018-06-06 DIAGNOSIS — I2510 Atherosclerotic heart disease of native coronary artery without angina pectoris: Secondary | ICD-10-CM

## 2018-06-06 MED ORDER — METOPROLOL SUCCINATE ER 50 MG PO TB24: ORAL_TABLET | ORAL | 1 refills | Status: DC

## 2018-06-06 NOTE — Progress Notes (Signed)
Cardiology Office Note:    Date:  06/06/2018   ID:  Martha Mueller, DOB 1956-07-04, MRN [id number]  PCP:  Ronita Hipps, MD  Cardiologist:  Jenne Campus, MD    Referring MD: Ronita Hipps, MD   Chief Complaint  Patient presents with  . Follow-up  Doing well  History of Present Illness:    Martha Mueller is a 62 y.o. female with cardiomyopathy which is ischemic in origin last assessment of left ventricular ejection fraction showed 4045% which was done in summer of this year.  She also have stress test stress test showed large fixed defect with small area of peri-infarct ischemia she is asymptomatic no chest pain tightness squeezing pressure been chest last time I try to switch her to [facility] however she could not tolerate it she developed a nosebleed and she is convinced this is related to [place].  I told her that my opinion is highly likely to be related to this medication but she does not want to go back on it she started taking lisinopril on the regular basis.  Denies having any palpitations no shortness of breath is there she is trying to work on quitting smoking smokes much less right now I told her ultimate goal is completely discontinue smoking.  Past Medical History:  Diagnosis Date  . COPD (chronic obstructive pulmonary disease) ([place])   . Heart attack ([place]) 2005  . Heart disease   . Hyperlipidemia   . Hypertension   . Stroke [facility])     Past Surgical History:  Procedure Laterality Date  . APPENDECTOMY    . CORONARY ANGIOPLASTY WITH STENT PLACEMENT    . TUBAL LIGATION      Current Medications: Current Meds  Medication Sig  . albuterol (PROAIR HFA) 108 (90 BASE) MCG/ACT inhaler Inhale 2 puffs into the lungs every 6 (six) hours as needed for wheezing or shortness of breath.  Marland Kitchen atorvastatin (LIPITOR) 20 MG tablet Take 20 mg by mouth daily.  Marland Kitchen buPROPion (WELLBUTRIN XL) 150 MG 24 hr tablet Take 1 tablet by mouth daily.  . citalopram (CELEXA) 20 MG tablet Take 20 mg  by mouth daily.  Marland Kitchen diltiazem (CARDIZEM CD) 120 MG 24 hr capsule Take 1 capsule by mouth daily.  . Fluticasone-Umeclidin-Vilant (TRELEGY ELLIPTA) 100-62.5-25 MCG/INH AEPB Inhale 1 puff into the lungs daily.  . metoprolol succinate (TOPROL-XL) 50 MG 24 hr tablet Take 50 mg by mouth daily. Take with or immediately following a meal.  . nitroGLYCERIN (NITROSTAT) 0.4 MG SL tablet Place 0.4 mg under the tongue every 5 (five) minutes as needed for chest pain.  . ranolazine (RANEXA) 500 MG 12 hr tablet Take 500 mg by mouth 2 (two) times daily.  Marland Kitchen rOPINIRole (REQUIP) 0.5 MG tablet Take 0.5 mg by mouth daily.     Allergies:   Patient has no known allergies.   Social History   Socioeconomic History  . Marital status: Single    Spouse name: Not on file  . Number of children: Not on file  . Years of education: Not on file  . Highest education level: Not on file  Occupational History  . Not on file  Social Needs  . Financial resource strain: Not on file  . Food insecurity:    Worry: Not on file    Inability: Not on file  . Transportation needs:    Medical: Not on file    Non-medical: Not on file  Tobacco Use  . Smoking status: Current Every  Day Smoker    Packs/day: 1.00    Years: 50.00    Pack years: 50.00    Types: Cigarettes  . Smokeless tobacco: Never Used  Substance and Sexual Activity  . Alcohol use: Yes    Comment: occ  . Drug use: No  . Sexual activity: Not on file  Lifestyle  . Physical activity:    Days per week: Not on file    Minutes per session: Not on file  . Stress: Not on file  Relationships  . Social connections:    Talks on phone: Not on file    Gets together: Not on file    Attends religious service: Not on file    Active member of club or organization: Not on file    Attends meetings of clubs or organizations: Not on file    Relationship status: Not on file  Other Topics Concern  . Not on file  Social History Narrative  . Not on file     Family  History: The patient's family history includes Emphysema in her mother; Heart disease in her father; Liver disease in her brother; Lung cancer in her mother. ROS:   Please see the history of present illness.    All 14 point review of systems negative except as described per history of present illness  EKGs/Labs/Other Studies Reviewed:      Recent Labs: 03/12/2018: BUN 11; Creatinine, Ser 0.92; Potassium 4.2; Sodium 138  Recent Lipid Panel No results found for: CHOL, TRIG, HDL, CHOLHDL, VLDL, LDLCALC, LDLDIRECT  Physical Exam:    VS:  BP 140/80   Pulse 79   Ht 5\' 4"  (1.626 m)   Wt 192 lb 9.6 oz (87.4 kg)   SpO2 98%   BMI 33.06 kg/m     Wt Readings from Last 3 Encounters:  06/06/18 192 lb 9.6 oz (87.4 kg)  03/05/18 185 lb (83.9 kg)  02/01/18 186 lb (84.4 kg)     GEN:  Well nourished, well developed in no acute distress HEENT: Normal NECK: No JVD; No carotid bruits LYMPHATICS: No lymphadenopathy CARDIAC: RRR, no murmurs, no rubs, no gallops RESPIRATORY:  Clear to auscultation without rales, wheezing or rhonchi  ABDOMEN: Soft, non-tender, non-distended MUSCULOSKELETAL:  No edema; No deformity  SKIN: Warm and dry LOWER EXTREMITIES: no swelling NEUROLOGIC:  Alert and oriented x 3 PSYCHIATRIC:  Normal affect   ASSESSMENT:    1. Coronary artery disease involving native coronary artery of native heart without angina pectoris   2. Ischemic cardiomyopathy   3. Dyslipidemia   4. Smoker    PLAN:    In order of problems listed above:  1. Coronary artery disease doing well from that point review she does have a large fixed defect on the stress test with small area of peri-infarct ischemia.  I will continue present management she is asymptomatic 2. Ischemic cardiomyopathy ejection fraction 4045% by last echocardiogram in the summer of this year.  Will discontinue Cardizem and increase dose of metoprolol to 75 mg daily.  First EKG will be done make sure it safe to do this  maneuver. 3. Dyslipidemia she is on statin which is atorvastatin 20 mg last LDL 51 which was in summer of this year. 4. Smoking she is trying to reduce the amount of cigarettes that she is used.  I told her ultimate goal is completely discontinue smoking.   Medication Adjustments/Labs and Tests Ordered: Current medicines are reviewed at length with the patient today.  Concerns regarding medicines are  outlined above.  No orders of the defined types were placed in this encounter.  Medication changes: No orders of the defined types were placed in this encounter.   Signed, Park Liter, MD, [facility] 06/06/2018 8:48 AM    [place]

## 2018-06-06 NOTE — Patient Instructions (Addendum)
Medication Instructions:  Your physician has recommended you make the following change in your medication:   STOP: Cardizem  INCREASE: Metoprolol succinate to 75 mg daily.   If you need a refill on your cardiac medications before your next appointment, please call your pharmacy.   Lab work: None.  If you have labs (blood work) drawn today and your tests are completely normal, you will receive your results only by: Marland Kitchen MyChart Message (if you have MyChart) OR . A paper copy in the mail If you have any lab test that is abnormal or we need to change your treatment, we will call you to review the results.  Testing/Procedures: None.   Follow-Up: At [facility], you and your health needs are our priority.  As part of our continuing mission to provide you with exceptional heart care, we have created designated Provider Care Teams.  These Care Teams include your primary Cardiologist (physician) and Advanced Practice Providers (APPs -  Physician Assistants and Nurse Practitioners) who all work together to provide you with the care you need, when you need it. You will need a follow up appointment in 5 months.  Please call our office 2 months in advance to schedule this appointment.  You may see No primary care provider on file. or another member of our [employer] Provider Team in [place]: Shirlee More, MD . Jyl Heinz, MD  Any Other Special Instructions Will Be Listed Below (If Applicable).

## 2018-07-23 DIAGNOSIS — J449 Chronic obstructive pulmonary disease, unspecified: Secondary | ICD-10-CM | POA: Diagnosis not present

## 2018-07-23 DIAGNOSIS — E785 Hyperlipidemia, unspecified: Secondary | ICD-10-CM | POA: Diagnosis not present

## 2018-07-23 DIAGNOSIS — I10 Essential (primary) hypertension: Secondary | ICD-10-CM | POA: Diagnosis not present

## 2018-08-22 DIAGNOSIS — E785 Hyperlipidemia, unspecified: Secondary | ICD-10-CM | POA: Diagnosis not present

## 2018-08-22 DIAGNOSIS — I10 Essential (primary) hypertension: Secondary | ICD-10-CM | POA: Diagnosis not present

## 2018-08-22 DIAGNOSIS — J449 Chronic obstructive pulmonary disease, unspecified: Secondary | ICD-10-CM | POA: Diagnosis not present

## 2018-09-21 DIAGNOSIS — E785 Hyperlipidemia, unspecified: Secondary | ICD-10-CM | POA: Diagnosis not present

## 2018-09-21 DIAGNOSIS — I10 Essential (primary) hypertension: Secondary | ICD-10-CM | POA: Diagnosis not present

## 2018-10-22 DIAGNOSIS — I10 Essential (primary) hypertension: Secondary | ICD-10-CM | POA: Diagnosis not present

## 2018-10-22 DIAGNOSIS — E785 Hyperlipidemia, unspecified: Secondary | ICD-10-CM | POA: Diagnosis not present

## 2018-10-24 ENCOUNTER — Telehealth: Payer: Self-pay | Admitting: Cardiology

## 2018-10-24 NOTE — Telephone Encounter (Signed)
Left message for patient to call the office for Poss Televisit 4/13.

## 2018-10-28 ENCOUNTER — Telehealth: Payer: Self-pay | Admitting: Cardiology

## 2018-10-28 NOTE — Telephone Encounter (Signed)
TELEPHONE CALL NOTE  Martha Mueller has been deemed a candidate for a follow-up tele-health visit to limit community exposure during the Covid-19 pandemic. I spoke with the patient via phone to ensure availability of phone/video source, confirm preferred email & phone number, and discuss instructions and expectations.  I reminded Martha Mueller to be prepared with any vital sign and/or heart rhythm information that could potentially be obtained via home monitoring, at the time of her visit. I reminded Martha Mueller to expect a phone call at the time of her visit if her visit.  Did the patient verbally acknowledge consent to treatment? YES  Janine Limbo 10/28/2018 10:34 AM   DOWNLOADING THE [place] TO SMARTPHONE  - If Apple, go to [employer] and type in WebEx in the search bar. [employer], the blue/green circle. The app is free but as with any other app downloads, their phone may require them to verify saved payment information or Apple password. The patient does NOT have to create an account.  - If Android, ask patient to go to [employer] and type in WebEx in the search bar. [place] [employer], the blue/green circle. The app is free but as with any other app downloads, their phone may require them to verify saved payment information or Android password. The patient does NOT have to create an account.   CONSENT FOR TELE-HEALTH VISIT - PLEASE REVIEW  I hereby voluntarily request, consent and authorize [facility] and its employed or contracted physicians, physician assistants, nurse practitioners or other licensed health care professionals (the Practitioner), to provide me with telemedicine health care services (the Services") as deemed necessary by the treating Practitioner. I acknowledge and consent to receive the Services by the Practitioner via telemedicine. I understand that the telemedicine visit will involve communicating with the  Practitioner through live audiovisual communication technology and the disclosure of certain medical information by electronic transmission. I acknowledge that I have been given the opportunity to request an in-person assessment or other available alternative prior to the telemedicine visit and am voluntarily participating in the telemedicine visit.  I understand that I have the right to withhold or withdraw my consent to the use of telemedicine in the course of my care at any time, without affecting my right to future care or treatment, and that the Practitioner or I may terminate the telemedicine visit at any time. I understand that I have the right to inspect all information obtained and/or recorded in the course of the telemedicine visit and may receive copies of available information for a reasonable fee.  I understand that some of the potential risks of receiving the Services via telemedicine include:   Delay or interruption in medical evaluation due to technological equipment failure or disruption;  Information transmitted may not be sufficient (e.g. poor resolution of images) to allow for appropriate medical decision making by the Practitioner; and/or   In rare instances, security protocols could fail, causing a breach of personal health information.  Furthermore, I acknowledge that it is my responsibility to provide information about my medical history, conditions and care that is complete and accurate to the best of my ability. I acknowledge that Practitioner's advice, recommendations, and/or decision may be based on factors not within their control, such as incomplete or inaccurate data provided by me or distortions of diagnostic images or specimens that may result from electronic transmissions. I understand that the practice of medicine is not  an Chief Strategy Officer and that Practitioner makes no warranties or guarantees regarding treatment outcomes. I acknowledge that I will receive a copy of this  consent concurrently upon execution via email to the email address I last provided but may also request a printed copy by calling the office of [facility].    I understand that my insurance will be billed for this visit.   I have read or had this consent read to me.  I understand the contents of this consent, which adequately explains the benefits and risks of the Services being provided via telemedicine.   I have been provided ample opportunity to ask questions regarding this consent and the Services and have had my questions answered to my satisfaction.  I give my informed consent for the services to be provided through the use of telemedicine in my medical care  By participating in this telemedicine visit I agree to the above. YES      Cardiac Questionnaire:    Since your last visit or hospitalization:    1. Have you been having new or worsening chest pain? NO   2. Have you been having new or worsening shortness of breath? NO 3. Have you been having new or worsening leg swelling, wt gain, or increase in abdominal girth (pants fitting more tightly)? NO   4. Have you had any passing out spells? NO    *A YES to any of these questions would result in the appointment being kept. *If all the answers to these questions are NO, we should indicate that given the current situation regarding the worldwide coronarvirus pandemic, at the recommendation of the CDC, we are looking to limit gatherings in our waiting area, and thus will reschedule their appointment beyond four weeks from today.   _____________   COVID-19 Pre-Screening Questions:   Do you currently have a fever? NO  Have you recently travelled on a cruise, internationally, or to [place], [place], [place], [place], [place], or [place], [place] [employer]) ? NO  Have you been in contact with someone that is currently pending confirmation of Covid19 testing or has been confirmed to have the [place] virus?  NO Are you currently experiencing fatigue or  cough? NO

## 2018-11-04 ENCOUNTER — Other Ambulatory Visit: Payer: Self-pay

## 2018-11-04 ENCOUNTER — Encounter: Payer: Self-pay | Admitting: Cardiology

## 2018-11-04 ENCOUNTER — Telehealth (INDEPENDENT_AMBULATORY_CARE_PROVIDER_SITE_OTHER): Payer: PPO | Admitting: Cardiology

## 2018-11-04 VITALS — BP 128/79 | HR 73

## 2018-11-04 DIAGNOSIS — I6930 Unspecified sequelae of cerebral infarction: Secondary | ICD-10-CM

## 2018-11-04 DIAGNOSIS — F17200 Nicotine dependence, unspecified, uncomplicated: Secondary | ICD-10-CM

## 2018-11-04 DIAGNOSIS — I2510 Atherosclerotic heart disease of native coronary artery without angina pectoris: Secondary | ICD-10-CM | POA: Diagnosis not present

## 2018-11-04 DIAGNOSIS — I252 Old myocardial infarction: Secondary | ICD-10-CM

## 2018-11-04 DIAGNOSIS — I255 Ischemic cardiomyopathy: Secondary | ICD-10-CM

## 2018-11-04 NOTE — Progress Notes (Signed)
Virtual Visit via Telephone Note   This visit type was conducted due to national recommendations for restrictions regarding the COVID-19 Pandemic (e.g. social distancing) in an effort to limit this patient's exposure and mitigate transmission in our community.  Due to her co-morbid illnesses, this patient is at least at moderate risk for complications without adequate follow up.  This format is felt to be most appropriate for this patient at this time.  The patient did not have access to video technology/had technical difficulties with video requiring transitioning to audio format only (telephone).  All issues noted in this document were discussed and addressed.  No physical exam could be performed with this format.  Please refer to the patient's chart for her  consent to telehealth for [facility].  Evaluation Performed:  Follow-up visit  This visit type was conducted due to national recommendations for restrictions regarding the COVID-19 Pandemic (e.g. social distancing).  This format is felt to be most appropriate for this patient at this time.  All issues noted in this document were discussed and addressed.  No physical exam was performed (except for noted visual exam findings with Video Visits).  Please refer to the patient's chart (MyChart message for video visits and phone note for telephone visits) for the patient's consent to telehealth for [facility].  Date:  11/04/2018  ID: Martha Mueller, DOB January 15, 1956, MRN [id number]   Patient Location:  [place]artha [place] [id number]   Provider location:   [place] Office  PCP:  Ronita Hipps, MD  Cardiologist:  Jenne Campus, MD     Chief Complaint: Doing well  History of Present Illness:    Martha Mueller is a 63 y.o. female  who presents via audio/video conferencing for a telehealth visit today.  Past medical history significant for coronary artery disease status post myocardial infarction status post CVA,  dyslipidemia.  She did have a stress test which showed some area of peri-infarct ischemia but otherwise seems to be doing well denies having any chest pain tightness squeezing pressure burning chest, she is able to walk climb stairs with no difficulties.  Denies having any palpitations no dizziness.  She still continue to smoke but is gradually slowly cutting down the number of cigarettes she smokes.  I told her the ultimate goal will be completely stop that habit.  She understands she will try to work on it.   The patient does not have symptoms concerning for COVID-19 infection (fever, chills, cough, or new SHORTNESS OF BREATH).    Prior CV studies:   The following studies were reviewed today:       Past Medical History:  Diagnosis Date  . COPD (chronic obstructive pulmonary disease) ([place])   . Heart attack ([place]) 2005  . Heart disease   . Hyperlipidemia   . Hypertension   . Stroke [facility])     Past Surgical History:  Procedure Laterality Date  . APPENDECTOMY    . CORONARY ANGIOPLASTY WITH STENT PLACEMENT    . TUBAL LIGATION       Current Meds  Medication Sig  . albuterol (PROAIR HFA) 108 (90 BASE) MCG/ACT inhaler Inhale 2 puffs into the lungs every 6 (six) hours as needed for wheezing or shortness of breath.  Marland Kitchen atorvastatin (LIPITOR) 20 MG tablet Take 20 mg by mouth daily.      Family History: The patient's family history includes Emphysema in her mother; Heart disease in her father; Liver disease in her  brother; Lung cancer in her mother.   ROS:   Please see the history of present illness.     All other systems reviewed and are negative.   Labs/Other Tests and Data Reviewed:     Recent Labs: 03/12/2018: BUN 11; Creatinine, Ser 0.92; Potassium 4.2; Sodium 138  Recent Lipid Panel No results found for: CHOL, TRIG, HDL, CHOLHDL, VLDL, LDLCALC, LDLDIRECT    Exam:    Vital Signs:  BP 128/79   Pulse 73     Wt Readings from Last 3 Encounters:  06/06/18 192 lb 9.6  oz (87.4 kg)  03/05/18 185 lb (83.9 kg)  02/01/18 186 lb (84.4 kg)     Well nourished, well developed female in no acute distress. Alert awake in exam 3 not in any distress we had nice conversation over the phone she is happy to be able to talk to me.  Diagnosis for this visit:   1. Coronary artery disease involving native coronary artery of native heart without angina pectoris   2. Old MI (myocardial infarction)   3. Ischemic cardiomyopathy   4. Smoker   5. Late effect of cerebrovascular accident (CVA)      ASSESSMENT & PLAN:    1.  Coronary artery disease asymptomatic.  The stress test done last year showed evidence of old myocardial infarction with small area of peri-infarct ischemia.  She is asymptomatic from that point of view and we discussed options today during the visit since she is asymptomatic we will simply continue present management which include medications and risk factors modifications.  We spent a great deal of time talking about need to discontinue smoking.  She understand and she will try to do it. 2.  Old myocardial infarction.  Ejection fraction 40 to 45%.  We tried [employer] however she developed side effect of this and does not want to continue.  She is on lisinopril which I will continue.  Does not have any signs or symptoms of decompensation. 3.  Ischemic cardiomyopathy discussion as above. 4.  Smoking long discussion about quitting I strongly recommended to do it she understand that she is trying to work on it. 5.  Late effect of CVA.  No new issues.  Again the key is risk factors modifications.  COVID-19 Education: The signs and symptoms of COVID-19 were discussed with the patient and how to seek care for testing (follow up with PCP or arrange E-visit).  The importance of social distancing was discussed today.  Patient Risk:   After full review of this patients clinical status, I feel that they are at least moderate risk at this time.  Time:   Today, I have  spent Cool minutes with the patient with telehealth technology discussing pt health issues. Visit was finished at 9:15 AM.    Medication Adjustments/Labs and Tests Ordered: Current medicines are reviewed at length with the patient today.  Concerns regarding medicines are outlined above.  No orders of the defined types were placed in this encounter.  Medication changes: No orders of the defined types were placed in this encounter.    Disposition: Follow-up in 3 months.  Signed, Park Liter, MD, [facility] 11/04/2018 9:17 AM    [place]

## 2018-11-04 NOTE — Patient Instructions (Signed)
Medication Instructions:  Your physician recommends that you continue on your current medications as directed. Please refer to the Current Medication list given to you today.  If you need a refill on your cardiac medications before your next appointment, please call your pharmacy.   Lab work: None.  If you have labs (blood work) drawn today and your tests are completely normal, you will receive your results only by: . MyChart Message (if you have MyChart) OR . A paper copy in the mail If you have any lab test that is abnormal or we need to change your treatment, we will call you to review the results.  Testing/Procedures: None.   Follow-Up: At [facility], you and your health needs are our priority.  As part of our continuing mission to provide you with exceptional heart care, we have created designated Provider Care Teams.  These Care Teams include your primary Cardiologist (physician) and Advanced Practice Providers (APPs -  Physician Assistants and Nurse Practitioners) who all work together to provide you with the care you need, when you need it. You will need a follow up appointment in 3 months.  Please call our office 2 months in advance to schedule this appointment.  You may see No primary care provider on file. or another member of our [facility] Provider Team in [employer]: Brian Munley, MD . Rajan Revankar, MD  Any Other Special Instructions Will Be Listed Below (If Applicable).     

## 2018-11-21 DIAGNOSIS — E785 Hyperlipidemia, unspecified: Secondary | ICD-10-CM | POA: Diagnosis not present

## 2018-11-21 DIAGNOSIS — I10 Essential (primary) hypertension: Secondary | ICD-10-CM | POA: Diagnosis not present

## 2018-12-21 DIAGNOSIS — I10 Essential (primary) hypertension: Secondary | ICD-10-CM | POA: Diagnosis not present

## 2018-12-21 DIAGNOSIS — E785 Hyperlipidemia, unspecified: Secondary | ICD-10-CM | POA: Diagnosis not present

## 2019-01-21 DIAGNOSIS — E785 Hyperlipidemia, unspecified: Secondary | ICD-10-CM | POA: Diagnosis not present

## 2019-01-21 DIAGNOSIS — I10 Essential (primary) hypertension: Secondary | ICD-10-CM | POA: Diagnosis not present

## 2019-02-11 ENCOUNTER — Other Ambulatory Visit: Payer: Self-pay

## 2019-02-11 ENCOUNTER — Encounter: Payer: Self-pay | Admitting: Cardiology

## 2019-02-11 ENCOUNTER — Telehealth (INDEPENDENT_AMBULATORY_CARE_PROVIDER_SITE_OTHER): Payer: PPO | Admitting: Cardiology

## 2019-02-11 VITALS — BP 151/82 | HR 81 | Wt 197.0 lb

## 2019-02-11 DIAGNOSIS — F17200 Nicotine dependence, unspecified, uncomplicated: Secondary | ICD-10-CM

## 2019-02-11 DIAGNOSIS — I10 Essential (primary) hypertension: Secondary | ICD-10-CM

## 2019-02-11 DIAGNOSIS — I2510 Atherosclerotic heart disease of native coronary artery without angina pectoris: Secondary | ICD-10-CM

## 2019-02-11 DIAGNOSIS — E785 Hyperlipidemia, unspecified: Secondary | ICD-10-CM

## 2019-02-11 DIAGNOSIS — I6930 Unspecified sequelae of cerebral infarction: Secondary | ICD-10-CM

## 2019-02-11 DIAGNOSIS — I255 Ischemic cardiomyopathy: Secondary | ICD-10-CM

## 2019-02-11 NOTE — Patient Instructions (Signed)
Medication Instructions:  Your physician recommends that you continue on your current medications as directed. Please refer to the Current Medication list given to you today.  If you need a refill on your cardiac medications before your next appointment, please call your pharmacy.   Lab work: Your physician recommends that you return for lab work when you come for your echo, make sure you are fasting: Lipids   If you have labs (blood work) drawn today and your tests are completely normal, you will receive your results only by: Marland Kitchen MyChart Message (if you have MyChart) OR . A paper copy in the mail If you have any lab test that is abnormal or we need to change your treatment, we will call you to review the results.  Testing/Procedures: Your physician has requested that you have an echocardiogram. Echocardiography is a painless test that uses sound waves to create images of your heart. It provides your doctor with information about the size and shape of your heart and how well your heart's chambers and valves are working. This procedure takes approximately one hour. There are no restrictions for this procedure.    Follow-Up: At [facility], you and your health needs are our priority.  As part of our continuing mission to provide you with exceptional heart care, we have created designated Provider Care Teams.  These Care Teams include your primary Cardiologist (physician) and Advanced Practice Providers (APPs -  Physician Assistants and Nurse Practitioners) who all work together to provide you with the care you need, when you need it. You will need a follow up appointment in 4 months.  Please call our office 2 months in advance to schedule this appointment.  You may see No primary care provider on file. or another member of our [employer] Provider Team in [place]: Shirlee More, MD . Jyl Heinz, MD  Any Other Special Instructions Will Be Listed Below (If Applicable).   Echocardiogram  An echocardiogram is a procedure that uses painless sound waves (ultrasound) to produce an image of the heart. Images from an echocardiogram can provide important information about:  Signs of coronary artery disease (CAD).  Aneurysm detection. An aneurysm is a weak or damaged part of an artery wall that bulges out from the normal force of blood pumping through the body.  Heart size and shape. Changes in the size or shape of the heart can be associated with certain conditions, including heart failure, aneurysm, and CAD.  Heart muscle function.  Heart valve function.  Signs of a past heart attack.  Fluid buildup around the heart.  Thickening of the heart muscle.  A tumor or infectious growth around the heart valves. Tell a health care provider about:  Any allergies you have.  All medicines you are taking, including vitamins, herbs, eye drops, creams, and over-the-counter medicines.  Any blood disorders you have.  Any surgeries you have had.  Any medical conditions you have.  Whether you are pregnant or may be pregnant. What are the risks? Generally, this is a safe procedure. However, problems may occur, including:  Allergic reaction to dye (contrast) that may be used during the procedure. What happens before the procedure? No specific preparation is needed. You may eat and drink normally. What happens during the procedure?   An IV tube may be inserted into one of your veins.  You may receive contrast through this tube. A contrast is an injection that improves the quality of the pictures from your heart.  A gel will  be applied to your chest.  A wand-like tool (transducer) will be moved over your chest. The gel will help to transmit the sound waves from the transducer.  The sound waves will harmlessly bounce off of your heart to allow the heart images to be captured in real-time motion. The images will be recorded on a computer. The procedure may vary among health care  providers and hospitals. What happens after the procedure?  You may return to your normal, everyday life, including diet, activities, and medicines, unless your health care provider tells you not to do that. Summary  An echocardiogram is a procedure that uses painless sound waves (ultrasound) to produce an image of the heart.  Images from an echocardiogram can provide important information about the size and shape of your heart, heart muscle function, heart valve function, and fluid buildup around your heart.  You do not need to do anything to prepare before this procedure. You may eat and drink normally.  After the echocardiogram is completed, you may return to your normal, everyday life, unless your health care provider tells you not to do that. This information is not intended to replace advice given to you by your health care provider. Make sure you discuss any questions you have with your health care provider. Document Released: 07/07/2000 Document Revised: 10/31/2018 Document Reviewed: 08/12/2016 Elsevier Patient Education  2020 [employer].

## 2019-02-11 NOTE — Addendum Note (Signed)
Addended by: Ashok Norris on: 02/11/2019 09:37 AM   Modules accepted: Orders

## 2019-02-11 NOTE — Progress Notes (Signed)
Virtual Visit via Telephone Note   This visit type was conducted due to national recommendations for restrictions regarding the COVID-19 Pandemic (e.g. social distancing) in an effort to limit this patient's exposure and mitigate transmission in our community.  Due to her co-morbid illnesses, this patient is at least at moderate risk for complications without adequate follow up.  This format is felt to be most appropriate for this patient at this time.  The patient did not have access to video technology/had technical difficulties with video requiring transitioning to audio format only (telephone).  All issues noted in this document were discussed and addressed.  No physical exam could be performed with this format.  Please refer to the patient's chart for her  consent to telehealth for [facility]  Evaluation Performed:  Follow-up visit  This visit type was conducted due to national recommendations for restrictions regarding the COVID-19 Pandemic (e.g. social distancing).  This format is felt to be most appropriate for this patient at this time.  All issues noted in this document were discussed and addressed.  No physical exam was performed (except for noted visual exam findings with Video Visits).  Please refer to the patient's chart (MyChart message for video visits and phone note for telephone visits) for the patient's consent to telehealth for [facility].  Date:  02/11/2019  ID: Martha Mueller, DOB [birth year], MRN [id number]   Patient Location: [place] [address]   Provider location:   [place] Office  PCP:  Ronita Hipps, MD  Cardiologist:  Jenne Campus, MD     Chief Complaint: I am doing fine  History of Present Illness:    Martha Mueller is a 63 y.o. female  who presents via audio/video conferencing for a telehealth visit today.  With coronary artery disease, stress test done last year showed old myocardial infarction with small area of  peri-infarct ischemia.  She denies having any chest pain but shortness of breath is the leading complaint.  She said when she move around when she walks she will get short of breath.  She still continue to smoke.  She complains of hot weather that we have right now.  She said she does not smoke at home she goes outside to smoke and she cannot stand for long time outside because it is so hot.   The patient does not have symptoms concerning for COVID-19 infection (fever, chills, cough, or new SHORTNESS OF BREATH).    Prior CV studies:   The following studies were reviewed today:  Stress test done last year showed old myocardial infarction with small area of peri-infarct ischemia     Past Medical History:  Diagnosis Date  . COPD (chronic obstructive pulmonary disease) ([place])   . Heart attack ([place]) 2005  . Heart disease   . Hyperlipidemia   . Hypertension   . Stroke [facility])     Past Surgical History:  Procedure Laterality Date  . APPENDECTOMY    . CORONARY ANGIOPLASTY WITH STENT PLACEMENT    . TUBAL LIGATION       Current Meds  Medication Sig  . albuterol (PROAIR HFA) 108 (90 BASE) MCG/ACT inhaler Inhale 2 puffs into the lungs every 6 (six) hours as needed for wheezing or shortness of breath.  Marland Kitchen atorvastatin (LIPITOR) 20 MG tablet Take 20 mg by mouth daily.  Marland Kitchen buPROPion (WELLBUTRIN XL) 150 MG 24 hr tablet Take 1 tablet by mouth daily.  . citalopram (CELEXA) 20  MG tablet Take 20 mg by mouth daily.  . Fluticasone-Umeclidin-Vilant (TRELEGY ELLIPTA) 100-62.5-25 MCG/INH AEPB Inhale 1 puff into the lungs daily.  . metoprolol succinate (TOPROL-XL) 50 MG 24 hr tablet TAKE 1 AND 1/2 TABLET EVERY DAY, WITH OR IMMEDIATELY FOLLOWING A MEAL  . nitroGLYCERIN (NITROSTAT) 0.4 MG SL tablet Place 0.4 mg under the tongue every 5 (five) minutes as needed for chest pain.  . ranolazine (RANEXA) 500 MG 12 hr tablet Take 500 mg by mouth 2 (two) times daily.  Marland Kitchen rOPINIRole (REQUIP) 0.5 MG tablet Take 0.5 mg  by mouth daily.      Family History: The patient's family history includes Emphysema in her mother; Heart disease in her father; Liver disease in her brother; Lung cancer in her mother.   ROS:   Please see the history of present illness.     All other systems reviewed and are negative.   Labs/Other Tests and Data Reviewed:     Recent Labs: 03/12/2018: BUN 11; Creatinine, Ser 0.92; Potassium 4.2; Sodium 138  Recent Lipid Panel No results found for: CHOL, TRIG, HDL, CHOLHDL, VLDL, LDLCALC, LDLDIRECT    Exam:    Vital Signs:  BP (!) 151/82   Pulse 81   Wt 197 lb (89.4 kg)   BMI 33.81 kg/m     Wt Readings from Last 3 Encounters:  02/11/19 197 lb (89.4 kg)  06/06/18 192 lb 9.6 oz (87.4 kg)  03/05/18 185 lb (83.9 kg)     Well nourished, well developed in no acute distress. Alert awake and x3.  Talking to me over the phone.  Unable to establish video link.  Overall doing well.  Diagnosis for this visit:   1. Coronary artery disease involving native coronary artery of native heart without angina pectoris   2. Ischemic cardiomyopathy   3. Essential hypertension   4. Dyslipidemia   5. Late effect of cerebrovascular accident (CVA)   6. Smoking      ASSESSMENT & PLAN:    1.  Coronary artery disease with small area of peri-infarct ischemia no chest pain but she does have shortness of breath more about potentially having angina equivalent.  I will schedule her also to have an echocardiogram to reassess left ventricular ejection fraction.  Previously her ejection fraction was 40 to 45%. 2.  Ischemic cardiomyopathy on appropriate medication which I will continue.  Echocardiogram will repeated to recheck left ventricular ejection fraction 3.  Essential hypertension blood pressure appears to be controlled continue present management. 4.  Dyslipidemia when she comes for echocardiogram asked her to come fasting we will check a fasting lipid profile 5.  Late effect CVA.  No new  TIA/CVA-like symptoms. 6.  Smoking we spent at least 5 minutes and I strongly encouraged her to quit she understand and she is working on it.  COVID-19 Education: The signs and symptoms of COVID-19 were discussed with the patient and how to seek care for testing (follow up with PCP or arrange E-visit).  The importance of social distancing was discussed today.  Patient Risk:   After full review of this patients clinical status, I feel that they are at least moderate risk at this time.  Time:   Today, I have spent 19 minutes with the patient with telehealth technology discussing pt health issues.  I spent 5 minutes reviewing her chart before the visit.  Visit was finished at 9:18 AM.    Medication Adjustments/Labs and Tests Ordered: Current medicines are reviewed at length with  the patient today.  Concerns regarding medicines are outlined above.  No orders of the defined types were placed in this encounter.  Medication changes: No orders of the defined types were placed in this encounter.    Disposition: Follow-up in the office in 4 months  Signed, Park Liter, MD, [facility] 02/11/2019 9:20 AM    [place]

## 2019-02-21 DIAGNOSIS — I10 Essential (primary) hypertension: Secondary | ICD-10-CM | POA: Diagnosis not present

## 2019-02-21 DIAGNOSIS — E785 Hyperlipidemia, unspecified: Secondary | ICD-10-CM | POA: Diagnosis not present

## 2019-03-24 DIAGNOSIS — I10 Essential (primary) hypertension: Secondary | ICD-10-CM | POA: Diagnosis not present

## 2019-03-24 DIAGNOSIS — E785 Hyperlipidemia, unspecified: Secondary | ICD-10-CM | POA: Diagnosis not present

## 2019-03-27 ENCOUNTER — Other Ambulatory Visit: Payer: Self-pay

## 2019-03-27 ENCOUNTER — Ambulatory Visit (INDEPENDENT_AMBULATORY_CARE_PROVIDER_SITE_OTHER): Payer: PPO

## 2019-03-27 DIAGNOSIS — I2510 Atherosclerotic heart disease of native coronary artery without angina pectoris: Secondary | ICD-10-CM

## 2019-03-27 NOTE — Progress Notes (Signed)
Complete echocardiogram has been performed.  Jimmy Fletcher Rathbun RDCS, RVT 

## 2019-04-23 DIAGNOSIS — J449 Chronic obstructive pulmonary disease, unspecified: Secondary | ICD-10-CM | POA: Diagnosis not present

## 2019-04-23 DIAGNOSIS — I10 Essential (primary) hypertension: Secondary | ICD-10-CM | POA: Diagnosis not present

## 2019-05-21 DIAGNOSIS — E7800 Pure hypercholesterolemia, unspecified: Secondary | ICD-10-CM | POA: Diagnosis not present

## 2019-05-21 DIAGNOSIS — K635 Polyp of colon: Secondary | ICD-10-CM | POA: Diagnosis not present

## 2019-05-21 DIAGNOSIS — Z23 Encounter for immunization: Secondary | ICD-10-CM | POA: Diagnosis not present

## 2019-05-21 DIAGNOSIS — I10 Essential (primary) hypertension: Secondary | ICD-10-CM | POA: Diagnosis not present

## 2019-05-21 DIAGNOSIS — J449 Chronic obstructive pulmonary disease, unspecified: Secondary | ICD-10-CM | POA: Diagnosis not present

## 2019-05-21 DIAGNOSIS — G2581 Restless legs syndrome: Secondary | ICD-10-CM | POA: Diagnosis not present

## 2019-05-21 DIAGNOSIS — Z6834 Body mass index (BMI) 34.0-34.9, adult: Secondary | ICD-10-CM | POA: Diagnosis not present

## 2019-05-21 DIAGNOSIS — F329 Major depressive disorder, single episode, unspecified: Secondary | ICD-10-CM | POA: Diagnosis not present

## 2019-05-21 DIAGNOSIS — Z0000 Encounter for general adult medical examination without abnormal findings: Secondary | ICD-10-CM | POA: Diagnosis not present

## 2019-05-21 DIAGNOSIS — Z1331 Encounter for screening for depression: Secondary | ICD-10-CM | POA: Diagnosis not present

## 2019-05-23 DIAGNOSIS — J449 Chronic obstructive pulmonary disease, unspecified: Secondary | ICD-10-CM | POA: Diagnosis not present

## 2019-05-23 DIAGNOSIS — I10 Essential (primary) hypertension: Secondary | ICD-10-CM | POA: Diagnosis not present

## 2019-05-23 DIAGNOSIS — E7800 Pure hypercholesterolemia, unspecified: Secondary | ICD-10-CM | POA: Diagnosis not present

## 2019-06-02 DIAGNOSIS — Z6834 Body mass index (BMI) 34.0-34.9, adult: Secondary | ICD-10-CM | POA: Diagnosis not present

## 2019-06-02 DIAGNOSIS — I10 Essential (primary) hypertension: Secondary | ICD-10-CM | POA: Diagnosis not present

## 2019-06-02 DIAGNOSIS — K625 Hemorrhage of anus and rectum: Secondary | ICD-10-CM | POA: Diagnosis not present

## 2019-06-02 DIAGNOSIS — D649 Anemia, unspecified: Secondary | ICD-10-CM | POA: Diagnosis not present

## 2019-06-02 DIAGNOSIS — K920 Hematemesis: Secondary | ICD-10-CM | POA: Diagnosis not present

## 2019-06-03 DIAGNOSIS — Z7982 Long term (current) use of aspirin: Secondary | ICD-10-CM | POA: Diagnosis not present

## 2019-06-03 DIAGNOSIS — K921 Melena: Secondary | ICD-10-CM | POA: Diagnosis not present

## 2019-06-03 DIAGNOSIS — K2960 Other gastritis without bleeding: Secondary | ICD-10-CM | POA: Diagnosis not present

## 2019-06-03 DIAGNOSIS — Z79899 Other long term (current) drug therapy: Secondary | ICD-10-CM | POA: Diagnosis not present

## 2019-06-03 DIAGNOSIS — I252 Old myocardial infarction: Secondary | ICD-10-CM | POA: Diagnosis not present

## 2019-06-03 DIAGNOSIS — Z8673 Personal history of transient ischemic attack (TIA), and cerebral infarction without residual deficits: Secondary | ICD-10-CM | POA: Diagnosis not present

## 2019-06-03 DIAGNOSIS — K920 Hematemesis: Secondary | ICD-10-CM | POA: Diagnosis not present

## 2019-06-03 DIAGNOSIS — K2990 Gastroduodenitis, unspecified, without bleeding: Secondary | ICD-10-CM | POA: Diagnosis not present

## 2019-06-03 DIAGNOSIS — K250 Acute gastric ulcer with hemorrhage: Secondary | ICD-10-CM | POA: Diagnosis not present

## 2019-06-03 DIAGNOSIS — K449 Diaphragmatic hernia without obstruction or gangrene: Secondary | ICD-10-CM | POA: Diagnosis not present

## 2019-06-03 DIAGNOSIS — K259 Gastric ulcer, unspecified as acute or chronic, without hemorrhage or perforation: Secondary | ICD-10-CM | POA: Diagnosis not present

## 2019-06-03 DIAGNOSIS — I10 Essential (primary) hypertension: Secondary | ICD-10-CM | POA: Diagnosis not present

## 2019-06-03 DIAGNOSIS — K2900 Acute gastritis without bleeding: Secondary | ICD-10-CM | POA: Diagnosis not present

## 2019-06-03 DIAGNOSIS — R195 Other fecal abnormalities: Secondary | ICD-10-CM | POA: Diagnosis not present

## 2019-06-05 DIAGNOSIS — M7989 Other specified soft tissue disorders: Secondary | ICD-10-CM | POA: Diagnosis not present

## 2019-06-05 DIAGNOSIS — M25571 Pain in right ankle and joints of right foot: Secondary | ICD-10-CM | POA: Diagnosis not present

## 2019-06-05 DIAGNOSIS — S63287A Dislocation of proximal interphalangeal joint of left little finger, initial encounter: Secondary | ICD-10-CM | POA: Diagnosis not present

## 2019-06-05 DIAGNOSIS — S62625A Displaced fracture of medial phalanx of left ring finger, initial encounter for closed fracture: Secondary | ICD-10-CM | POA: Diagnosis not present

## 2019-06-05 DIAGNOSIS — S62627A Displaced fracture of medial phalanx of left little finger, initial encounter for closed fracture: Secondary | ICD-10-CM | POA: Diagnosis not present

## 2019-06-05 DIAGNOSIS — S99921A Unspecified injury of right foot, initial encounter: Secondary | ICD-10-CM | POA: Diagnosis not present

## 2019-06-05 DIAGNOSIS — S63285A Dislocation of proximal interphalangeal joint of left ring finger, initial encounter: Secondary | ICD-10-CM | POA: Diagnosis not present

## 2019-06-05 DIAGNOSIS — S93401A Sprain of unspecified ligament of right ankle, initial encounter: Secondary | ICD-10-CM | POA: Diagnosis not present

## 2019-06-05 DIAGNOSIS — M79671 Pain in right foot: Secondary | ICD-10-CM | POA: Diagnosis not present

## 2019-06-10 ENCOUNTER — Other Ambulatory Visit: Payer: Self-pay

## 2019-06-10 ENCOUNTER — Ambulatory Visit (INDEPENDENT_AMBULATORY_CARE_PROVIDER_SITE_OTHER): Payer: PPO | Admitting: Cardiology

## 2019-06-10 ENCOUNTER — Encounter: Payer: Self-pay | Admitting: Cardiology

## 2019-06-10 VITALS — BP 194/84 | HR 94 | Ht 63.5 in | Wt 199.0 lb

## 2019-06-10 DIAGNOSIS — F17200 Nicotine dependence, unspecified, uncomplicated: Secondary | ICD-10-CM | POA: Diagnosis not present

## 2019-06-10 DIAGNOSIS — E785 Hyperlipidemia, unspecified: Secondary | ICD-10-CM | POA: Diagnosis not present

## 2019-06-10 DIAGNOSIS — I6930 Unspecified sequelae of cerebral infarction: Secondary | ICD-10-CM | POA: Diagnosis not present

## 2019-06-10 DIAGNOSIS — I2510 Atherosclerotic heart disease of native coronary artery without angina pectoris: Secondary | ICD-10-CM | POA: Diagnosis not present

## 2019-06-10 MED ORDER — AMLODIPINE BESYLATE 5 MG PO TABS: 5.0000 mg | ORAL_TABLET | Freq: Every day | ORAL | 1 refills | Status: DC

## 2019-06-10 NOTE — Progress Notes (Signed)
Cardiology Office Note:    Date:  06/10/2019   ID:  Martha Mueller, DOB 08/26/1955, MRN [id number]  PCP:  Ronita Hipps, MD  Cardiologist:  Jenne Campus, MD    Referring MD: Ronita Hipps, MD   Chief Complaint  Patient presents with  . Follow-up    History of Present Illness:    Martha Mueller is a 63 y.o. female with coronary artery disease.  She did have a stress test done a year ago which showed old myocardial infarction with ejection fraction 45% and small peri-infarct ischemia.  Luckily she is asymptomatic.  Since I seen her last time there were a lot of new developments.  She did have upper GI bleeding.  She ended up having endoscopy however she does not know results of the test.  She described to have vomiting with fresh red blood also top of her stool.  Complain of being weak tired and exhausted.  Denies having a chest pain, tightness, pressure, burning in the chest.  She was working well on quitting smoking however after all the bleeding she is back to 1 pack/day.  Past Medical History:  Diagnosis Date  . COPD (chronic obstructive pulmonary disease) ([place])   . Heart attack ([place]) 2005  . Heart disease   . Hyperlipidemia   . Hypertension   . Stroke [facility])     Past Surgical History:  Procedure Laterality Date  . APPENDECTOMY    . CORONARY ANGIOPLASTY WITH STENT PLACEMENT    . TUBAL LIGATION      Current Medications: Current Meds  Medication Sig  . albuterol (PROAIR HFA) 108 (90 BASE) MCG/ACT inhaler Inhale 2 puffs into the lungs every 6 (six) hours as needed for wheezing or shortness of breath.  Marland Kitchen atorvastatin (LIPITOR) 20 MG tablet Take 20 mg by mouth daily.  Marland Kitchen buPROPion (WELLBUTRIN XL) 150 MG 24 hr tablet Take 1 tablet by mouth daily.  . citalopram (CELEXA) 20 MG tablet Take 20 mg by mouth daily.  . Fluticasone-Umeclidin-Vilant (TRELEGY ELLIPTA) 100-62.5-25 MCG/INH AEPB Inhale 1 puff into the lungs daily.  . metoprolol succinate (TOPROL-XL) 50 MG 24 hr tablet  TAKE 1 AND 1/2 TABLET EVERY DAY, WITH OR IMMEDIATELY FOLLOWING A MEAL  . nitroGLYCERIN (NITROSTAT) 0.4 MG SL tablet Place 0.4 mg under the tongue every 5 (five) minutes as needed for chest pain.  . ranolazine (RANEXA) 500 MG 12 hr tablet Take 500 mg by mouth 2 (two) times daily.  Marland Kitchen rOPINIRole (REQUIP) 0.5 MG tablet Take 0.5 mg by mouth daily.     Allergies:   Patient has no known allergies.   Social History   Socioeconomic History  . Marital status: Single    Spouse name: Not on file  . Number of children: Not on file  . Years of education: Not on file  . Highest education level: Not on file  Occupational History  . Not on file  Social Needs  . Financial resource strain: Not on file  . Food insecurity    Worry: Not on file    Inability: Not on file  . Transportation needs    Medical: Not on file    Non-medical: Not on file  Tobacco Use  . Smoking status: Current Some Day Smoker    Packs/day: 1.00    Years: 50.00    Pack years: 50.00    Types: Cigarettes  . Smokeless tobacco: Never Used  Substance and Sexual Activity  . Alcohol use: Yes  Comment: occ  . Drug use: No  . Sexual activity: Not on file  Lifestyle  . Physical activity    Days per week: Not on file    Minutes per session: Not on file  . Stress: Not on file  Relationships  . Social Herbalist on phone: Not on file    Gets together: Not on file    Attends religious service: Not on file    Active member of club or organization: Not on file    Attends meetings of clubs or organizations: Not on file    Relationship status: Not on file  Other Topics Concern  . Not on file  Social History Narrative  . Not on file     Family History: The patient's family history includes Emphysema in her mother; Heart disease in her father; Liver disease in her brother; Lung cancer in her mother. ROS:   Please see the history of present illness.    All 14 point review of systems negative except as described  per history of present illness  EKGs/Labs/Other Studies Reviewed:      Recent Labs: No results found for requested labs within last 8760 hours.  Recent Lipid Panel No results found for: CHOL, TRIG, HDL, CHOLHDL, VLDL, LDLCALC, LDLDIRECT  Physical Exam:    VS:  BP (!) 194/84   Pulse 94   Ht 5' 3.5" (1.613 m)   Wt 199 lb (90.3 kg)   SpO2 96%   BMI 34.70 kg/m     Wt Readings from Last 3 Encounters:  06/10/19 199 lb (90.3 kg)  02/11/19 197 lb (89.4 kg)  06/06/18 192 lb 9.6 oz (87.4 kg)     GEN:  Well nourished, well developed in no acute distress HEENT: Normal NECK: No JVD; No carotid bruits LYMPHATICS: No lymphadenopathy CARDIAC: RRR, no murmurs, no rubs, no gallops RESPIRATORY:  Clear to auscultation without rales, wheezing or rhonchi  ABDOMEN: Soft, non-tender, non-distended MUSCULOSKELETAL:  No edema; No deformity  SKIN: Warm and dry LOWER EXTREMITIES: no swelling NEUROLOGIC:  Alert and oriented x 3 PSYCHIATRIC:  Normal affect   ASSESSMENT:    1. Coronary artery disease involving native coronary artery of native heart without angina pectoris   2. Late effect of cerebrovascular accident (CVA)   3. Dyslipidemia   4. Smoking    PLAN:    In order of problems listed above:  1. Coronary artery disease with old blood cardia function with small area of peri-infarct ischemia.  Asymptomatic.  We will continue present management. 2. Essential hypertension blood pressure elevated many times.  I will ask her to start taking Norvasc 5 mg daily on top of all medication she takes 3. Late effect CVA denies having any new issues no TIA/CVA-like symptoms. 4. Dyslipidemia will call primary care physician to get her fasting lipid profile she is on Lipitor 20 which I will continue. 5. Smoking obviously big problem we had a long discussion about this I strongly recommended to quit. 6. GI bleeding that being addressed by GI specialist as well as internal medicine team.   Medication  Adjustments/Labs and Tests Ordered: Current medicines are reviewed at length with the patient today.  Concerns regarding medicines are outlined above.  No orders of the defined types were placed in this encounter.  Medication changes: No orders of the defined types were placed in this encounter.   Signed, Park Liter, MD, [facility] 06/10/2019 8:48 AM    [place]

## 2019-06-10 NOTE — Patient Instructions (Signed)
Medication Instructions:  Your physician has recommended you make the following change in your medication:   START: Amlodipine 5 mg daily   *If you need a refill on your cardiac medications before your next appointment, please call your pharmacy*  Lab Work: None  If you have labs (blood work) drawn today and your tests are completely normal, you will receive your results only by: Marland Kitchen MyChart Message (if you have MyChart) OR . A paper copy in the mail If you have any lab test that is abnormal or we need to change your treatment, we will call you to review the results.  Testing/Procedures: None,   Follow-Up: At [facility], you and your health needs are our priority.  As part of our continuing mission to provide you with exceptional heart care, we have created designated Provider Care Teams.  These Care Teams include your primary Cardiologist (physician) and Advanced Practice Providers (APPs -  Physician Assistants and Nurse Practitioners) who all work together to provide you with the care you need, when you need it.  Your next appointment:   2 month fu   The format for your next appointment:   In Person  Provider:   Jenne Campus, MD  Other Instructions  Amlodipine tablets What is this medicine? AMLODIPINE (am LOE di peen) is a calcium-channel blocker. It affects the amount of calcium found in your heart and muscle cells. This relaxes your blood vessels, which can reduce the amount of work the heart has to do. This medicine is used to lower high blood pressure. It is also used to prevent chest pain. This medicine may be used for other purposes; ask your health care provider or pharmacist if you have questions. COMMON BRAND NAME(S): Norvasc What should I tell my health care provider before I take this medicine? They need to know if you have any of these conditions:  heart disease  liver disease  an unusual or allergic reaction to amlodipine, other medicines, foods,  dyes, or preservatives  pregnant or trying to get pregnant  breast-feeding How should I use this medicine? Take this medicine by mouth with a glass of water. Follow the directions on the prescription label. You can take it with or without food. If it upsets your stomach, take it with food. Take your medicine at regular intervals. Do not take it more often than directed. Do not stop taking except on your doctor's advice. Talk to your pediatrician regarding the use of this medicine in children. While this drug may be prescribed for children as young as 6 years for selected conditions, precautions do apply. Patients over 67 years of age may have a stronger reaction and need a smaller dose. Overdosage: If you think you have taken too much of this medicine contact a poison control center or emergency room at once. NOTE: This medicine is only for you. Do not share this medicine with others. What if I miss a dose? If you miss a dose, take it as soon as you can. If it is almost time for your next dose, take only that dose. Do not take double or extra doses. What may interact with this medicine? Do not take this medicine with any of the following medications:  tranylcypromine This medicine may also interact with the following medications:  clarithromycin  cyclosporine  diltiazem  itraconazole  simvastatin  tacrolimus This list may not describe all possible interactions. Give your health care provider a list of all the medicines, herbs, non-prescription drugs, or dietary  supplements you use. Also tell them if you smoke, drink alcohol, or use illegal drugs. Some items may interact with your medicine. What should I watch for while using this medicine? Visit your healthcare professional for regular checks on your progress. Check your blood pressure as directed. Ask your healthcare professional what your blood pressure should be and when you should contact him or her. Do not treat yourself for  coughs, colds, or pain while you are using this medicine without asking your healthcare professional for advice. Some medicines may increase your blood pressure. You may get dizzy. Do not drive, use machinery, or do anything that needs mental alertness until you know how this medicine affects you. Do not stand or sit up quickly, especially if you are an older patient. This reduces the risk of dizzy or fainting spells. Avoid alcoholic drinks; they can make you dizzier. What side effects may I notice from receiving this medicine? Side effects that you should report to your doctor or health care professional as soon as possible:  allergic reactions like skin rash, itching or hives; swelling of the face, lips, or tongue  fast, irregular heartbeat  signs and symptoms of low blood pressure like dizziness; feeling faint or lightheaded, falls; unusually weak or tired  swelling of ankles, feet, hands Side effects that usually do not require medical attention (report these to your doctor or health care professional if they continue or are bothersome):  dry mouth  facial flushing  headache  stomach pain  tiredness This list may not describe all possible side effects. Call your doctor for medical advice about side effects. You may report side effects to FDA at 1-800-FDA-1088. Where should I keep my medicine? Keep out of the reach of children. Store at room temperature between 59 and 86 degrees F (15 and 30 degrees C). Throw away any unused medicine after the expiration date. NOTE: This sheet is a summary. It may not cover all possible information. If you have questions about this medicine, talk to your doctor, pharmacist, or health care provider.  2020 Elsevier/Gold Standard (2018-02-01 15:07:10)

## 2019-06-12 DIAGNOSIS — S63285A Dislocation of proximal interphalangeal joint of left ring finger, initial encounter: Secondary | ICD-10-CM | POA: Diagnosis not present

## 2019-06-12 DIAGNOSIS — S63287A Dislocation of proximal interphalangeal joint of left little finger, initial encounter: Secondary | ICD-10-CM | POA: Diagnosis not present

## 2019-06-16 ENCOUNTER — Ambulatory Visit: Payer: PPO | Admitting: Cardiology

## 2019-06-18 DIAGNOSIS — Z8673 Personal history of transient ischemic attack (TIA), and cerebral infarction without residual deficits: Secondary | ICD-10-CM | POA: Diagnosis not present

## 2019-06-18 DIAGNOSIS — J449 Chronic obstructive pulmonary disease, unspecified: Secondary | ICD-10-CM | POA: Diagnosis not present

## 2019-06-18 DIAGNOSIS — I252 Old myocardial infarction: Secondary | ICD-10-CM | POA: Diagnosis not present

## 2019-06-18 DIAGNOSIS — S63285A Dislocation of proximal interphalangeal joint of left ring finger, initial encounter: Secondary | ICD-10-CM | POA: Diagnosis not present

## 2019-06-18 DIAGNOSIS — I509 Heart failure, unspecified: Secondary | ICD-10-CM | POA: Diagnosis not present

## 2019-06-18 DIAGNOSIS — Z7902 Long term (current) use of antithrombotics/antiplatelets: Secondary | ICD-10-CM | POA: Diagnosis not present

## 2019-06-18 DIAGNOSIS — E669 Obesity, unspecified: Secondary | ICD-10-CM | POA: Diagnosis not present

## 2019-06-18 DIAGNOSIS — E785 Hyperlipidemia, unspecified: Secondary | ICD-10-CM | POA: Diagnosis not present

## 2019-06-18 DIAGNOSIS — Z7982 Long term (current) use of aspirin: Secondary | ICD-10-CM | POA: Diagnosis not present

## 2019-06-18 DIAGNOSIS — I4891 Unspecified atrial fibrillation: Secondary | ICD-10-CM | POA: Diagnosis not present

## 2019-06-18 DIAGNOSIS — F17210 Nicotine dependence, cigarettes, uncomplicated: Secondary | ICD-10-CM | POA: Diagnosis not present

## 2019-06-18 DIAGNOSIS — I110 Hypertensive heart disease with heart failure: Secondary | ICD-10-CM | POA: Diagnosis not present

## 2019-06-18 DIAGNOSIS — S62615A Displaced fracture of proximal phalanx of left ring finger, initial encounter for closed fracture: Secondary | ICD-10-CM | POA: Diagnosis not present

## 2019-06-18 DIAGNOSIS — Z79899 Other long term (current) drug therapy: Secondary | ICD-10-CM | POA: Diagnosis not present

## 2019-06-23 DIAGNOSIS — I10 Essential (primary) hypertension: Secondary | ICD-10-CM | POA: Diagnosis not present

## 2019-06-23 DIAGNOSIS — E7800 Pure hypercholesterolemia, unspecified: Secondary | ICD-10-CM | POA: Diagnosis not present

## 2019-07-17 DIAGNOSIS — S63287A Dislocation of proximal interphalangeal joint of left little finger, initial encounter: Secondary | ICD-10-CM | POA: Diagnosis not present

## 2019-07-21 DIAGNOSIS — K920 Hematemesis: Secondary | ICD-10-CM | POA: Diagnosis not present

## 2019-07-21 DIAGNOSIS — K250 Acute gastric ulcer with hemorrhage: Secondary | ICD-10-CM | POA: Diagnosis not present

## 2019-08-11 ENCOUNTER — Ambulatory Visit: Payer: PPO | Admitting: Cardiology

## 2019-08-23 DIAGNOSIS — J449 Chronic obstructive pulmonary disease, unspecified: Secondary | ICD-10-CM | POA: Diagnosis not present

## 2019-08-23 DIAGNOSIS — I10 Essential (primary) hypertension: Secondary | ICD-10-CM | POA: Diagnosis not present

## 2019-08-23 DIAGNOSIS — E7800 Pure hypercholesterolemia, unspecified: Secondary | ICD-10-CM | POA: Diagnosis not present

## 2019-09-21 DIAGNOSIS — J449 Chronic obstructive pulmonary disease, unspecified: Secondary | ICD-10-CM | POA: Diagnosis not present

## 2019-09-21 DIAGNOSIS — E7800 Pure hypercholesterolemia, unspecified: Secondary | ICD-10-CM | POA: Diagnosis not present

## 2019-10-22 DIAGNOSIS — I10 Essential (primary) hypertension: Secondary | ICD-10-CM | POA: Diagnosis not present

## 2019-10-22 DIAGNOSIS — E7800 Pure hypercholesterolemia, unspecified: Secondary | ICD-10-CM | POA: Diagnosis not present

## 2019-11-21 DIAGNOSIS — E7800 Pure hypercholesterolemia, unspecified: Secondary | ICD-10-CM | POA: Diagnosis not present

## 2019-11-21 DIAGNOSIS — I10 Essential (primary) hypertension: Secondary | ICD-10-CM | POA: Diagnosis not present

## 2019-12-08 ENCOUNTER — Other Ambulatory Visit: Payer: Self-pay | Admitting: Cardiology

## 2019-12-22 DIAGNOSIS — I10 Essential (primary) hypertension: Secondary | ICD-10-CM | POA: Diagnosis not present

## 2019-12-22 DIAGNOSIS — F329 Major depressive disorder, single episode, unspecified: Secondary | ICD-10-CM | POA: Diagnosis not present

## 2019-12-22 DIAGNOSIS — E785 Hyperlipidemia, unspecified: Secondary | ICD-10-CM | POA: Diagnosis not present

## 2019-12-24 DIAGNOSIS — R0681 Apnea, not elsewhere classified: Secondary | ICD-10-CM | POA: Diagnosis not present

## 2019-12-24 DIAGNOSIS — I10 Essential (primary) hypertension: Secondary | ICD-10-CM | POA: Diagnosis not present

## 2019-12-24 DIAGNOSIS — J449 Chronic obstructive pulmonary disease, unspecified: Secondary | ICD-10-CM | POA: Diagnosis not present

## 2019-12-24 DIAGNOSIS — Z6838 Body mass index (BMI) 38.0-38.9, adult: Secondary | ICD-10-CM | POA: Diagnosis not present

## 2019-12-24 DIAGNOSIS — E7800 Pure hypercholesterolemia, unspecified: Secondary | ICD-10-CM | POA: Diagnosis not present

## 2019-12-24 DIAGNOSIS — Z79899 Other long term (current) drug therapy: Secondary | ICD-10-CM | POA: Diagnosis not present

## 2020-01-21 DIAGNOSIS — J9691 Respiratory failure, unspecified with hypoxia: Secondary | ICD-10-CM | POA: Diagnosis not present

## 2020-01-21 DIAGNOSIS — E785 Hyperlipidemia, unspecified: Secondary | ICD-10-CM | POA: Diagnosis not present

## 2020-01-21 DIAGNOSIS — F329 Major depressive disorder, single episode, unspecified: Secondary | ICD-10-CM | POA: Diagnosis not present

## 2020-01-21 DIAGNOSIS — I10 Essential (primary) hypertension: Secondary | ICD-10-CM | POA: Diagnosis not present

## 2020-02-19 ENCOUNTER — Other Ambulatory Visit: Payer: Self-pay

## 2020-02-20 DIAGNOSIS — J9691 Respiratory failure, unspecified with hypoxia: Secondary | ICD-10-CM | POA: Diagnosis not present

## 2020-02-21 DIAGNOSIS — F329 Major depressive disorder, single episode, unspecified: Secondary | ICD-10-CM | POA: Diagnosis not present

## 2020-02-21 DIAGNOSIS — E785 Hyperlipidemia, unspecified: Secondary | ICD-10-CM | POA: Diagnosis not present

## 2020-02-21 DIAGNOSIS — I10 Essential (primary) hypertension: Secondary | ICD-10-CM | POA: Diagnosis not present

## 2020-03-22 DIAGNOSIS — J9691 Respiratory failure, unspecified with hypoxia: Secondary | ICD-10-CM | POA: Diagnosis not present

## 2020-03-23 DIAGNOSIS — E785 Hyperlipidemia, unspecified: Secondary | ICD-10-CM | POA: Diagnosis not present

## 2020-03-23 DIAGNOSIS — I10 Essential (primary) hypertension: Secondary | ICD-10-CM | POA: Diagnosis not present

## 2020-03-23 DIAGNOSIS — F329 Major depressive disorder, single episode, unspecified: Secondary | ICD-10-CM | POA: Diagnosis not present

## 2020-04-22 DIAGNOSIS — I10 Essential (primary) hypertension: Secondary | ICD-10-CM | POA: Diagnosis not present

## 2020-04-22 DIAGNOSIS — J9691 Respiratory failure, unspecified with hypoxia: Secondary | ICD-10-CM | POA: Diagnosis not present

## 2020-04-22 DIAGNOSIS — F329 Major depressive disorder, single episode, unspecified: Secondary | ICD-10-CM | POA: Diagnosis not present

## 2020-04-22 DIAGNOSIS — E785 Hyperlipidemia, unspecified: Secondary | ICD-10-CM | POA: Diagnosis not present

## 2020-05-22 DIAGNOSIS — E785 Hyperlipidemia, unspecified: Secondary | ICD-10-CM | POA: Diagnosis not present

## 2020-05-22 DIAGNOSIS — F329 Major depressive disorder, single episode, unspecified: Secondary | ICD-10-CM | POA: Diagnosis not present

## 2020-05-22 DIAGNOSIS — I10 Essential (primary) hypertension: Secondary | ICD-10-CM | POA: Diagnosis not present

## 2020-05-22 DIAGNOSIS — J9691 Respiratory failure, unspecified with hypoxia: Secondary | ICD-10-CM | POA: Diagnosis not present

## 2020-05-23 ENCOUNTER — Other Ambulatory Visit: Payer: Self-pay | Admitting: Cardiology

## 2020-06-03 DIAGNOSIS — Z8719 Personal history of other diseases of the digestive system: Secondary | ICD-10-CM | POA: Diagnosis not present

## 2020-06-03 DIAGNOSIS — Z87891 Personal history of nicotine dependence: Secondary | ICD-10-CM | POA: Diagnosis not present

## 2020-06-03 DIAGNOSIS — Z7902 Long term (current) use of antithrombotics/antiplatelets: Secondary | ICD-10-CM | POA: Diagnosis not present

## 2020-06-03 DIAGNOSIS — I160 Hypertensive urgency: Secondary | ICD-10-CM | POA: Diagnosis not present

## 2020-06-03 DIAGNOSIS — F32A Depression, unspecified: Secondary | ICD-10-CM | POA: Diagnosis not present

## 2020-06-03 DIAGNOSIS — E669 Obesity, unspecified: Secondary | ICD-10-CM | POA: Diagnosis not present

## 2020-06-03 DIAGNOSIS — J9601 Acute respiratory failure with hypoxia: Secondary | ICD-10-CM | POA: Diagnosis not present

## 2020-06-03 DIAGNOSIS — J441 Chronic obstructive pulmonary disease with (acute) exacerbation: Secondary | ICD-10-CM | POA: Diagnosis not present

## 2020-06-03 DIAGNOSIS — I252 Old myocardial infarction: Secondary | ICD-10-CM | POA: Diagnosis not present

## 2020-06-03 DIAGNOSIS — R0602 Shortness of breath: Secondary | ICD-10-CM | POA: Diagnosis not present

## 2020-06-03 DIAGNOSIS — I5032 Chronic diastolic (congestive) heart failure: Secondary | ICD-10-CM | POA: Diagnosis not present

## 2020-06-03 DIAGNOSIS — Z8673 Personal history of transient ischemic attack (TIA), and cerebral infarction without residual deficits: Secondary | ICD-10-CM | POA: Diagnosis not present

## 2020-06-03 DIAGNOSIS — Z6834 Body mass index (BMI) 34.0-34.9, adult: Secondary | ICD-10-CM | POA: Diagnosis not present

## 2020-06-03 DIAGNOSIS — Z7952 Long term (current) use of systemic steroids: Secondary | ICD-10-CM | POA: Diagnosis not present

## 2020-06-03 DIAGNOSIS — R591 Generalized enlarged lymph nodes: Secondary | ICD-10-CM | POA: Diagnosis not present

## 2020-06-03 DIAGNOSIS — I110 Hypertensive heart disease with heart failure: Secondary | ICD-10-CM | POA: Diagnosis not present

## 2020-06-03 DIAGNOSIS — J9621 Acute and chronic respiratory failure with hypoxia: Secondary | ICD-10-CM | POA: Diagnosis not present

## 2020-06-03 DIAGNOSIS — F419 Anxiety disorder, unspecified: Secondary | ICD-10-CM | POA: Diagnosis not present

## 2020-06-03 DIAGNOSIS — Z7982 Long term (current) use of aspirin: Secondary | ICD-10-CM | POA: Diagnosis not present

## 2020-06-03 DIAGNOSIS — Z23 Encounter for immunization: Secondary | ICD-10-CM | POA: Diagnosis not present

## 2020-06-03 DIAGNOSIS — Z79899 Other long term (current) drug therapy: Secondary | ICD-10-CM | POA: Diagnosis not present

## 2020-06-14 DIAGNOSIS — J441 Chronic obstructive pulmonary disease with (acute) exacerbation: Secondary | ICD-10-CM | POA: Diagnosis not present

## 2020-06-22 DIAGNOSIS — E785 Hyperlipidemia, unspecified: Secondary | ICD-10-CM | POA: Diagnosis not present

## 2020-06-22 DIAGNOSIS — F329 Major depressive disorder, single episode, unspecified: Secondary | ICD-10-CM | POA: Diagnosis not present

## 2020-06-22 DIAGNOSIS — J9691 Respiratory failure, unspecified with hypoxia: Secondary | ICD-10-CM | POA: Diagnosis not present

## 2020-06-22 DIAGNOSIS — I10 Essential (primary) hypertension: Secondary | ICD-10-CM | POA: Diagnosis not present

## 2020-06-23 DIAGNOSIS — Z6836 Body mass index (BMI) 36.0-36.9, adult: Secondary | ICD-10-CM | POA: Diagnosis not present

## 2020-06-23 DIAGNOSIS — Z0000 Encounter for general adult medical examination without abnormal findings: Secondary | ICD-10-CM | POA: Diagnosis not present

## 2020-06-23 DIAGNOSIS — J449 Chronic obstructive pulmonary disease, unspecified: Secondary | ICD-10-CM | POA: Diagnosis not present

## 2020-06-23 DIAGNOSIS — Z1331 Encounter for screening for depression: Secondary | ICD-10-CM | POA: Diagnosis not present

## 2020-07-14 DIAGNOSIS — J441 Chronic obstructive pulmonary disease with (acute) exacerbation: Secondary | ICD-10-CM | POA: Diagnosis not present

## 2020-07-22 DIAGNOSIS — F329 Major depressive disorder, single episode, unspecified: Secondary | ICD-10-CM | POA: Diagnosis not present

## 2020-07-22 DIAGNOSIS — I10 Essential (primary) hypertension: Secondary | ICD-10-CM | POA: Diagnosis not present

## 2020-07-22 DIAGNOSIS — J9691 Respiratory failure, unspecified with hypoxia: Secondary | ICD-10-CM | POA: Diagnosis not present

## 2020-07-22 DIAGNOSIS — E785 Hyperlipidemia, unspecified: Secondary | ICD-10-CM | POA: Diagnosis not present

## 2020-08-14 DIAGNOSIS — J441 Chronic obstructive pulmonary disease with (acute) exacerbation: Secondary | ICD-10-CM | POA: Diagnosis not present

## 2020-08-22 DIAGNOSIS — F329 Major depressive disorder, single episode, unspecified: Secondary | ICD-10-CM | POA: Diagnosis not present

## 2020-08-22 DIAGNOSIS — J9691 Respiratory failure, unspecified with hypoxia: Secondary | ICD-10-CM | POA: Diagnosis not present

## 2020-08-22 DIAGNOSIS — E785 Hyperlipidemia, unspecified: Secondary | ICD-10-CM | POA: Diagnosis not present

## 2020-08-22 DIAGNOSIS — I10 Essential (primary) hypertension: Secondary | ICD-10-CM | POA: Diagnosis not present

## 2020-09-06 ENCOUNTER — Other Ambulatory Visit: Payer: Self-pay | Admitting: Cardiology

## 2020-09-06 NOTE — Telephone Encounter (Signed)
Rx refill sent to pharmacy. 

## 2020-09-14 DIAGNOSIS — J441 Chronic obstructive pulmonary disease with (acute) exacerbation: Secondary | ICD-10-CM | POA: Diagnosis not present

## 2020-09-20 DIAGNOSIS — E785 Hyperlipidemia, unspecified: Secondary | ICD-10-CM | POA: Diagnosis not present

## 2020-09-20 DIAGNOSIS — I10 Essential (primary) hypertension: Secondary | ICD-10-CM | POA: Diagnosis not present

## 2020-09-20 DIAGNOSIS — F329 Major depressive disorder, single episode, unspecified: Secondary | ICD-10-CM | POA: Diagnosis not present

## 2020-10-12 DIAGNOSIS — J441 Chronic obstructive pulmonary disease with (acute) exacerbation: Secondary | ICD-10-CM | POA: Diagnosis not present

## 2020-10-20 DIAGNOSIS — J9691 Respiratory failure, unspecified with hypoxia: Secondary | ICD-10-CM | POA: Diagnosis not present

## 2020-10-20 DIAGNOSIS — I10 Essential (primary) hypertension: Secondary | ICD-10-CM | POA: Diagnosis not present

## 2020-10-20 DIAGNOSIS — E785 Hyperlipidemia, unspecified: Secondary | ICD-10-CM | POA: Diagnosis not present

## 2020-10-20 DIAGNOSIS — F329 Major depressive disorder, single episode, unspecified: Secondary | ICD-10-CM | POA: Diagnosis not present

## 2020-11-12 DIAGNOSIS — J441 Chronic obstructive pulmonary disease with (acute) exacerbation: Secondary | ICD-10-CM | POA: Diagnosis not present

## 2020-11-20 DIAGNOSIS — I10 Essential (primary) hypertension: Secondary | ICD-10-CM | POA: Diagnosis not present

## 2020-11-20 DIAGNOSIS — F329 Major depressive disorder, single episode, unspecified: Secondary | ICD-10-CM | POA: Diagnosis not present

## 2020-11-20 DIAGNOSIS — J9691 Respiratory failure, unspecified with hypoxia: Secondary | ICD-10-CM | POA: Diagnosis not present

## 2020-11-20 DIAGNOSIS — E785 Hyperlipidemia, unspecified: Secondary | ICD-10-CM | POA: Diagnosis not present

## 2020-12-02 ENCOUNTER — Other Ambulatory Visit: Payer: Self-pay | Admitting: Cardiology

## 2020-12-12 DIAGNOSIS — J441 Chronic obstructive pulmonary disease with (acute) exacerbation: Secondary | ICD-10-CM | POA: Diagnosis not present

## 2020-12-20 DIAGNOSIS — F329 Major depressive disorder, single episode, unspecified: Secondary | ICD-10-CM | POA: Diagnosis not present

## 2020-12-20 DIAGNOSIS — I10 Essential (primary) hypertension: Secondary | ICD-10-CM | POA: Diagnosis not present

## 2020-12-20 DIAGNOSIS — E785 Hyperlipidemia, unspecified: Secondary | ICD-10-CM | POA: Diagnosis not present

## 2020-12-20 DIAGNOSIS — J9691 Respiratory failure, unspecified with hypoxia: Secondary | ICD-10-CM | POA: Diagnosis not present

## 2021-01-12 DIAGNOSIS — J441 Chronic obstructive pulmonary disease with (acute) exacerbation: Secondary | ICD-10-CM | POA: Diagnosis not present

## 2021-01-20 DIAGNOSIS — E785 Hyperlipidemia, unspecified: Secondary | ICD-10-CM | POA: Diagnosis not present

## 2021-01-20 DIAGNOSIS — F329 Major depressive disorder, single episode, unspecified: Secondary | ICD-10-CM | POA: Diagnosis not present

## 2021-01-20 DIAGNOSIS — I10 Essential (primary) hypertension: Secondary | ICD-10-CM | POA: Diagnosis not present

## 2021-01-20 DIAGNOSIS — J9691 Respiratory failure, unspecified with hypoxia: Secondary | ICD-10-CM | POA: Diagnosis not present

## 2021-02-11 DIAGNOSIS — J441 Chronic obstructive pulmonary disease with (acute) exacerbation: Secondary | ICD-10-CM | POA: Diagnosis not present

## 2021-02-19 DIAGNOSIS — J9691 Respiratory failure, unspecified with hypoxia: Secondary | ICD-10-CM | POA: Diagnosis not present

## 2021-03-14 DIAGNOSIS — J441 Chronic obstructive pulmonary disease with (acute) exacerbation: Secondary | ICD-10-CM | POA: Diagnosis not present

## 2021-03-17 DIAGNOSIS — Z9861 Coronary angioplasty status: Secondary | ICD-10-CM | POA: Diagnosis not present

## 2021-03-17 DIAGNOSIS — J9691 Respiratory failure, unspecified with hypoxia: Secondary | ICD-10-CM | POA: Diagnosis not present

## 2021-03-17 DIAGNOSIS — I5022 Chronic systolic (congestive) heart failure: Secondary | ICD-10-CM | POA: Diagnosis not present

## 2021-03-17 DIAGNOSIS — J9811 Atelectasis: Secondary | ICD-10-CM | POA: Diagnosis not present

## 2021-03-17 DIAGNOSIS — J156 Pneumonia due to other aerobic Gram-negative bacteria: Secondary | ICD-10-CM | POA: Diagnosis not present

## 2021-03-17 DIAGNOSIS — J942 Hemothorax: Secondary | ICD-10-CM | POA: Diagnosis not present

## 2021-03-17 DIAGNOSIS — J9690 Respiratory failure, unspecified, unspecified whether with hypoxia or hypercapnia: Secondary | ICD-10-CM | POA: Diagnosis not present

## 2021-03-17 DIAGNOSIS — K7460 Unspecified cirrhosis of liver: Secondary | ICD-10-CM | POA: Diagnosis not present

## 2021-03-17 DIAGNOSIS — Z6837 Body mass index (BMI) 37.0-37.9, adult: Secondary | ICD-10-CM | POA: Diagnosis not present

## 2021-03-17 DIAGNOSIS — D649 Anemia, unspecified: Secondary | ICD-10-CM | POA: Diagnosis not present

## 2021-03-17 DIAGNOSIS — J9621 Acute and chronic respiratory failure with hypoxia: Secondary | ICD-10-CM | POA: Diagnosis not present

## 2021-03-17 DIAGNOSIS — J9809 Other diseases of bronchus, not elsewhere classified: Secondary | ICD-10-CM | POA: Diagnosis not present

## 2021-03-17 DIAGNOSIS — E785 Hyperlipidemia, unspecified: Secondary | ICD-10-CM | POA: Diagnosis not present

## 2021-03-17 DIAGNOSIS — J181 Lobar pneumonia, unspecified organism: Secondary | ICD-10-CM | POA: Diagnosis not present

## 2021-03-17 DIAGNOSIS — J449 Chronic obstructive pulmonary disease, unspecified: Secondary | ICD-10-CM | POA: Diagnosis not present

## 2021-03-17 DIAGNOSIS — I5020 Unspecified systolic (congestive) heart failure: Secondary | ICD-10-CM | POA: Diagnosis not present

## 2021-03-17 DIAGNOSIS — Z720 Tobacco use: Secondary | ICD-10-CM | POA: Diagnosis not present

## 2021-03-17 DIAGNOSIS — Z79899 Other long term (current) drug therapy: Secondary | ICD-10-CM | POA: Diagnosis not present

## 2021-03-17 DIAGNOSIS — F418 Other specified anxiety disorders: Secondary | ICD-10-CM | POA: Diagnosis not present

## 2021-03-17 DIAGNOSIS — F17210 Nicotine dependence, cigarettes, uncomplicated: Secondary | ICD-10-CM | POA: Diagnosis not present

## 2021-03-17 DIAGNOSIS — Z049 Encounter for examination and observation for unspecified reason: Secondary | ICD-10-CM | POA: Diagnosis not present

## 2021-03-17 DIAGNOSIS — Z4659 Encounter for fitting and adjustment of other gastrointestinal appliance and device: Secondary | ICD-10-CM | POA: Diagnosis not present

## 2021-03-17 DIAGNOSIS — E860 Dehydration: Secondary | ICD-10-CM | POA: Diagnosis not present

## 2021-03-17 DIAGNOSIS — Z20822 Contact with and (suspected) exposure to covid-19: Secondary | ICD-10-CM | POA: Diagnosis not present

## 2021-03-17 DIAGNOSIS — E7800 Pure hypercholesterolemia, unspecified: Secondary | ICD-10-CM | POA: Diagnosis not present

## 2021-03-17 DIAGNOSIS — R918 Other nonspecific abnormal finding of lung field: Secondary | ICD-10-CM | POA: Diagnosis not present

## 2021-03-17 DIAGNOSIS — G4733 Obstructive sleep apnea (adult) (pediatric): Secondary | ICD-10-CM | POA: Diagnosis not present

## 2021-03-17 DIAGNOSIS — Z66 Do not resuscitate: Secondary | ICD-10-CM | POA: Diagnosis not present

## 2021-03-17 DIAGNOSIS — I110 Hypertensive heart disease with heart failure: Secondary | ICD-10-CM | POA: Diagnosis not present

## 2021-03-17 DIAGNOSIS — I2510 Atherosclerotic heart disease of native coronary artery without angina pectoris: Secondary | ICD-10-CM | POA: Diagnosis not present

## 2021-03-17 DIAGNOSIS — J440 Chronic obstructive pulmonary disease with acute lower respiratory infection: Secondary | ICD-10-CM | POA: Diagnosis not present

## 2021-03-17 DIAGNOSIS — C3491 Malignant neoplasm of unspecified part of right bronchus or lung: Secondary | ICD-10-CM | POA: Diagnosis not present

## 2021-03-17 DIAGNOSIS — J9601 Acute respiratory failure with hypoxia: Secondary | ICD-10-CM | POA: Diagnosis not present

## 2021-03-17 DIAGNOSIS — E871 Hypo-osmolality and hyponatremia: Secondary | ICD-10-CM | POA: Diagnosis not present

## 2021-03-17 DIAGNOSIS — E669 Obesity, unspecified: Secondary | ICD-10-CM | POA: Diagnosis not present

## 2021-03-17 DIAGNOSIS — I700 Atherosclerosis of aorta: Secondary | ICD-10-CM | POA: Diagnosis not present

## 2021-03-17 DIAGNOSIS — R0602 Shortness of breath: Secondary | ICD-10-CM | POA: Diagnosis not present

## 2021-03-17 DIAGNOSIS — F32A Depression, unspecified: Secondary | ICD-10-CM | POA: Diagnosis not present

## 2021-03-17 DIAGNOSIS — I5032 Chronic diastolic (congestive) heart failure: Secondary | ICD-10-CM | POA: Diagnosis not present

## 2021-03-17 DIAGNOSIS — J9622 Acute and chronic respiratory failure with hypercapnia: Secondary | ICD-10-CM | POA: Diagnosis not present

## 2021-03-17 DIAGNOSIS — F329 Major depressive disorder, single episode, unspecified: Secondary | ICD-10-CM | POA: Diagnosis not present

## 2021-03-17 DIAGNOSIS — Z4682 Encounter for fitting and adjustment of non-vascular catheter: Secondary | ICD-10-CM | POA: Diagnosis not present

## 2021-03-17 DIAGNOSIS — J90 Pleural effusion, not elsewhere classified: Secondary | ICD-10-CM | POA: Diagnosis not present

## 2021-03-17 DIAGNOSIS — R591 Generalized enlarged lymph nodes: Secondary | ICD-10-CM | POA: Diagnosis not present

## 2021-03-17 DIAGNOSIS — K219 Gastro-esophageal reflux disease without esophagitis: Secondary | ICD-10-CM | POA: Diagnosis not present

## 2021-03-17 DIAGNOSIS — Z781 Physical restraint status: Secondary | ICD-10-CM | POA: Diagnosis not present

## 2021-03-17 DIAGNOSIS — J441 Chronic obstructive pulmonary disease with (acute) exacerbation: Secondary | ICD-10-CM | POA: Diagnosis not present

## 2021-03-17 DIAGNOSIS — I252 Old myocardial infarction: Secondary | ICD-10-CM | POA: Diagnosis not present

## 2021-03-17 DIAGNOSIS — I952 Hypotension due to drugs: Secondary | ICD-10-CM | POA: Diagnosis not present

## 2021-03-17 DIAGNOSIS — I255 Ischemic cardiomyopathy: Secondary | ICD-10-CM | POA: Diagnosis not present

## 2021-03-17 DIAGNOSIS — Z8673 Personal history of transient ischemic attack (TIA), and cerebral infarction without residual deficits: Secondary | ICD-10-CM | POA: Diagnosis not present

## 2021-03-17 DIAGNOSIS — C3401 Malignant neoplasm of right main bronchus: Secondary | ICD-10-CM | POA: Diagnosis not present

## 2021-03-17 DIAGNOSIS — R590 Localized enlarged lymph nodes: Secondary | ICD-10-CM | POA: Diagnosis not present

## 2021-03-17 DIAGNOSIS — F419 Anxiety disorder, unspecified: Secondary | ICD-10-CM | POA: Diagnosis not present

## 2021-03-17 DIAGNOSIS — J189 Pneumonia, unspecified organism: Secondary | ICD-10-CM | POA: Diagnosis not present

## 2021-03-17 DIAGNOSIS — R9431 Abnormal electrocardiogram [ECG] [EKG]: Secondary | ICD-10-CM | POA: Diagnosis not present

## 2021-03-17 DIAGNOSIS — Z515 Encounter for palliative care: Secondary | ICD-10-CM | POA: Diagnosis not present

## 2021-03-17 DIAGNOSIS — Z8711 Personal history of peptic ulcer disease: Secondary | ICD-10-CM | POA: Diagnosis not present

## 2021-03-17 DIAGNOSIS — R0603 Acute respiratory distress: Secondary | ICD-10-CM | POA: Diagnosis not present

## 2021-03-17 DIAGNOSIS — I10 Essential (primary) hypertension: Secondary | ICD-10-CM | POA: Diagnosis not present

## 2021-03-17 DIAGNOSIS — Z9911 Dependence on respirator [ventilator] status: Secondary | ICD-10-CM | POA: Diagnosis not present

## 2021-03-17 DIAGNOSIS — Z9981 Dependence on supplemental oxygen: Secondary | ICD-10-CM | POA: Diagnosis not present

## 2021-03-17 DIAGNOSIS — R846 Abnormal cytological findings in specimens from respiratory organs and thorax: Secondary | ICD-10-CM | POA: Diagnosis not present

## 2021-03-17 DIAGNOSIS — C3490 Malignant neoplasm of unspecified part of unspecified bronchus or lung: Secondary | ICD-10-CM | POA: Diagnosis not present

## 2021-03-17 DIAGNOSIS — C3411 Malignant neoplasm of upper lobe, right bronchus or lung: Secondary | ICD-10-CM | POA: Diagnosis not present

## 2021-03-18 DIAGNOSIS — J156 Pneumonia due to other aerobic Gram-negative bacteria: Secondary | ICD-10-CM | POA: Diagnosis not present

## 2021-03-18 DIAGNOSIS — J189 Pneumonia, unspecified organism: Secondary | ICD-10-CM | POA: Diagnosis not present

## 2021-03-18 DIAGNOSIS — J9621 Acute and chronic respiratory failure with hypoxia: Secondary | ICD-10-CM | POA: Diagnosis not present

## 2021-03-19 DIAGNOSIS — R0602 Shortness of breath: Secondary | ICD-10-CM | POA: Diagnosis not present

## 2021-03-19 DIAGNOSIS — J189 Pneumonia, unspecified organism: Secondary | ICD-10-CM | POA: Diagnosis not present

## 2021-03-19 DIAGNOSIS — J156 Pneumonia due to other aerobic Gram-negative bacteria: Secondary | ICD-10-CM | POA: Diagnosis not present

## 2021-03-19 DIAGNOSIS — J9621 Acute and chronic respiratory failure with hypoxia: Secondary | ICD-10-CM | POA: Diagnosis not present

## 2021-03-20 DIAGNOSIS — J156 Pneumonia due to other aerobic Gram-negative bacteria: Secondary | ICD-10-CM | POA: Diagnosis not present

## 2021-03-20 DIAGNOSIS — J9621 Acute and chronic respiratory failure with hypoxia: Secondary | ICD-10-CM | POA: Diagnosis not present

## 2021-03-20 DIAGNOSIS — J189 Pneumonia, unspecified organism: Secondary | ICD-10-CM | POA: Diagnosis not present

## 2021-03-21 DIAGNOSIS — J9809 Other diseases of bronchus, not elsewhere classified: Secondary | ICD-10-CM | POA: Diagnosis not present

## 2021-03-21 DIAGNOSIS — Z9981 Dependence on supplemental oxygen: Secondary | ICD-10-CM | POA: Diagnosis not present

## 2021-03-21 DIAGNOSIS — J9621 Acute and chronic respiratory failure with hypoxia: Secondary | ICD-10-CM | POA: Diagnosis not present

## 2021-03-21 DIAGNOSIS — J189 Pneumonia, unspecified organism: Secondary | ICD-10-CM | POA: Diagnosis not present

## 2021-03-21 DIAGNOSIS — J156 Pneumonia due to other aerobic Gram-negative bacteria: Secondary | ICD-10-CM | POA: Diagnosis not present

## 2021-03-21 DIAGNOSIS — J441 Chronic obstructive pulmonary disease with (acute) exacerbation: Secondary | ICD-10-CM | POA: Diagnosis not present

## 2021-03-21 DIAGNOSIS — G4733 Obstructive sleep apnea (adult) (pediatric): Secondary | ICD-10-CM | POA: Diagnosis not present

## 2021-03-22 DIAGNOSIS — G4733 Obstructive sleep apnea (adult) (pediatric): Secondary | ICD-10-CM | POA: Diagnosis not present

## 2021-03-22 DIAGNOSIS — Z9981 Dependence on supplemental oxygen: Secondary | ICD-10-CM | POA: Diagnosis not present

## 2021-03-22 DIAGNOSIS — J156 Pneumonia due to other aerobic Gram-negative bacteria: Secondary | ICD-10-CM | POA: Diagnosis not present

## 2021-03-22 DIAGNOSIS — J9621 Acute and chronic respiratory failure with hypoxia: Secondary | ICD-10-CM | POA: Diagnosis not present

## 2021-03-22 DIAGNOSIS — J9809 Other diseases of bronchus, not elsewhere classified: Secondary | ICD-10-CM | POA: Diagnosis not present

## 2021-03-22 DIAGNOSIS — J189 Pneumonia, unspecified organism: Secondary | ICD-10-CM | POA: Diagnosis not present

## 2021-03-22 DIAGNOSIS — J9691 Respiratory failure, unspecified with hypoxia: Secondary | ICD-10-CM | POA: Diagnosis not present

## 2021-03-22 DIAGNOSIS — J441 Chronic obstructive pulmonary disease with (acute) exacerbation: Secondary | ICD-10-CM | POA: Diagnosis not present

## 2021-03-22 DIAGNOSIS — J181 Lobar pneumonia, unspecified organism: Secondary | ICD-10-CM | POA: Diagnosis not present

## 2021-03-23 DIAGNOSIS — F329 Major depressive disorder, single episode, unspecified: Secondary | ICD-10-CM | POA: Diagnosis not present

## 2021-03-23 DIAGNOSIS — E785 Hyperlipidemia, unspecified: Secondary | ICD-10-CM | POA: Diagnosis not present

## 2021-03-23 DIAGNOSIS — J9621 Acute and chronic respiratory failure with hypoxia: Secondary | ICD-10-CM | POA: Diagnosis not present

## 2021-03-23 DIAGNOSIS — I10 Essential (primary) hypertension: Secondary | ICD-10-CM | POA: Diagnosis not present

## 2021-03-23 DIAGNOSIS — J9809 Other diseases of bronchus, not elsewhere classified: Secondary | ICD-10-CM | POA: Diagnosis not present

## 2021-03-23 DIAGNOSIS — J189 Pneumonia, unspecified organism: Secondary | ICD-10-CM | POA: Diagnosis not present

## 2021-03-23 DIAGNOSIS — J441 Chronic obstructive pulmonary disease with (acute) exacerbation: Secondary | ICD-10-CM | POA: Diagnosis not present

## 2021-03-23 DIAGNOSIS — Z9911 Dependence on respirator [ventilator] status: Secondary | ICD-10-CM | POA: Diagnosis not present

## 2021-03-23 DIAGNOSIS — Z9981 Dependence on supplemental oxygen: Secondary | ICD-10-CM | POA: Diagnosis not present

## 2021-03-23 DIAGNOSIS — G4733 Obstructive sleep apnea (adult) (pediatric): Secondary | ICD-10-CM | POA: Diagnosis not present

## 2021-03-23 DIAGNOSIS — J156 Pneumonia due to other aerobic Gram-negative bacteria: Secondary | ICD-10-CM | POA: Diagnosis not present

## 2021-03-24 DIAGNOSIS — J9690 Respiratory failure, unspecified, unspecified whether with hypoxia or hypercapnia: Secondary | ICD-10-CM | POA: Diagnosis not present

## 2021-03-24 DIAGNOSIS — J156 Pneumonia due to other aerobic Gram-negative bacteria: Secondary | ICD-10-CM | POA: Diagnosis not present

## 2021-03-24 DIAGNOSIS — J189 Pneumonia, unspecified organism: Secondary | ICD-10-CM | POA: Diagnosis not present

## 2021-03-24 DIAGNOSIS — J9621 Acute and chronic respiratory failure with hypoxia: Secondary | ICD-10-CM | POA: Diagnosis not present

## 2021-03-25 DIAGNOSIS — J9621 Acute and chronic respiratory failure with hypoxia: Secondary | ICD-10-CM | POA: Diagnosis not present

## 2021-03-25 DIAGNOSIS — J441 Chronic obstructive pulmonary disease with (acute) exacerbation: Secondary | ICD-10-CM | POA: Diagnosis not present

## 2021-03-25 DIAGNOSIS — G4733 Obstructive sleep apnea (adult) (pediatric): Secondary | ICD-10-CM | POA: Diagnosis not present

## 2021-03-25 DIAGNOSIS — J189 Pneumonia, unspecified organism: Secondary | ICD-10-CM | POA: Diagnosis not present

## 2021-03-25 DIAGNOSIS — R0603 Acute respiratory distress: Secondary | ICD-10-CM | POA: Diagnosis not present

## 2021-03-25 DIAGNOSIS — J9809 Other diseases of bronchus, not elsewhere classified: Secondary | ICD-10-CM | POA: Diagnosis not present

## 2021-03-25 DIAGNOSIS — R918 Other nonspecific abnormal finding of lung field: Secondary | ICD-10-CM | POA: Diagnosis not present

## 2021-03-25 DIAGNOSIS — Z9911 Dependence on respirator [ventilator] status: Secondary | ICD-10-CM | POA: Diagnosis not present

## 2021-03-25 DIAGNOSIS — Z4682 Encounter for fitting and adjustment of non-vascular catheter: Secondary | ICD-10-CM | POA: Diagnosis not present

## 2021-03-25 DIAGNOSIS — J156 Pneumonia due to other aerobic Gram-negative bacteria: Secondary | ICD-10-CM | POA: Diagnosis not present

## 2021-03-25 DIAGNOSIS — I5032 Chronic diastolic (congestive) heart failure: Secondary | ICD-10-CM | POA: Diagnosis not present

## 2021-03-25 DIAGNOSIS — J9601 Acute respiratory failure with hypoxia: Secondary | ICD-10-CM | POA: Diagnosis not present

## 2021-03-25 DIAGNOSIS — Z9981 Dependence on supplemental oxygen: Secondary | ICD-10-CM | POA: Diagnosis not present

## 2021-03-26 DIAGNOSIS — J9690 Respiratory failure, unspecified, unspecified whether with hypoxia or hypercapnia: Secondary | ICD-10-CM | POA: Diagnosis not present

## 2021-03-26 DIAGNOSIS — J189 Pneumonia, unspecified organism: Secondary | ICD-10-CM | POA: Diagnosis not present

## 2021-03-26 DIAGNOSIS — J156 Pneumonia due to other aerobic Gram-negative bacteria: Secondary | ICD-10-CM | POA: Diagnosis not present

## 2021-03-26 DIAGNOSIS — J9621 Acute and chronic respiratory failure with hypoxia: Secondary | ICD-10-CM | POA: Diagnosis not present

## 2021-03-27 DIAGNOSIS — J156 Pneumonia due to other aerobic Gram-negative bacteria: Secondary | ICD-10-CM | POA: Diagnosis not present

## 2021-03-27 DIAGNOSIS — J9621 Acute and chronic respiratory failure with hypoxia: Secondary | ICD-10-CM | POA: Diagnosis not present

## 2021-03-27 DIAGNOSIS — J189 Pneumonia, unspecified organism: Secondary | ICD-10-CM | POA: Diagnosis not present

## 2021-03-28 DIAGNOSIS — J9621 Acute and chronic respiratory failure with hypoxia: Secondary | ICD-10-CM | POA: Diagnosis not present

## 2021-03-28 DIAGNOSIS — J156 Pneumonia due to other aerobic Gram-negative bacteria: Secondary | ICD-10-CM | POA: Diagnosis not present

## 2021-03-28 DIAGNOSIS — J189 Pneumonia, unspecified organism: Secondary | ICD-10-CM | POA: Diagnosis not present

## 2021-03-29 DIAGNOSIS — J189 Pneumonia, unspecified organism: Secondary | ICD-10-CM | POA: Diagnosis not present

## 2021-03-29 DIAGNOSIS — Z9981 Dependence on supplemental oxygen: Secondary | ICD-10-CM | POA: Diagnosis not present

## 2021-03-29 DIAGNOSIS — J441 Chronic obstructive pulmonary disease with (acute) exacerbation: Secondary | ICD-10-CM | POA: Diagnosis not present

## 2021-03-29 DIAGNOSIS — J9621 Acute and chronic respiratory failure with hypoxia: Secondary | ICD-10-CM | POA: Diagnosis not present

## 2021-03-29 DIAGNOSIS — J156 Pneumonia due to other aerobic Gram-negative bacteria: Secondary | ICD-10-CM | POA: Diagnosis not present

## 2021-03-29 DIAGNOSIS — Z9911 Dependence on respirator [ventilator] status: Secondary | ICD-10-CM | POA: Diagnosis not present

## 2021-03-29 DIAGNOSIS — G4733 Obstructive sleep apnea (adult) (pediatric): Secondary | ICD-10-CM | POA: Diagnosis not present

## 2021-03-29 DIAGNOSIS — J9809 Other diseases of bronchus, not elsewhere classified: Secondary | ICD-10-CM | POA: Diagnosis not present

## 2021-03-29 DIAGNOSIS — R918 Other nonspecific abnormal finding of lung field: Secondary | ICD-10-CM | POA: Diagnosis not present

## 2021-03-30 DIAGNOSIS — J9809 Other diseases of bronchus, not elsewhere classified: Secondary | ICD-10-CM | POA: Diagnosis not present

## 2021-03-30 DIAGNOSIS — J189 Pneumonia, unspecified organism: Secondary | ICD-10-CM | POA: Diagnosis not present

## 2021-03-30 DIAGNOSIS — J9621 Acute and chronic respiratory failure with hypoxia: Secondary | ICD-10-CM | POA: Diagnosis not present

## 2021-03-30 DIAGNOSIS — J156 Pneumonia due to other aerobic Gram-negative bacteria: Secondary | ICD-10-CM | POA: Diagnosis not present

## 2021-03-30 DIAGNOSIS — J441 Chronic obstructive pulmonary disease with (acute) exacerbation: Secondary | ICD-10-CM | POA: Diagnosis not present

## 2021-03-30 DIAGNOSIS — J9690 Respiratory failure, unspecified, unspecified whether with hypoxia or hypercapnia: Secondary | ICD-10-CM | POA: Diagnosis not present

## 2021-03-30 DIAGNOSIS — Z9911 Dependence on respirator [ventilator] status: Secondary | ICD-10-CM | POA: Diagnosis not present

## 2021-03-30 DIAGNOSIS — Z9981 Dependence on supplemental oxygen: Secondary | ICD-10-CM | POA: Diagnosis not present

## 2021-03-30 DIAGNOSIS — G4733 Obstructive sleep apnea (adult) (pediatric): Secondary | ICD-10-CM | POA: Diagnosis not present

## 2021-03-31 DIAGNOSIS — J449 Chronic obstructive pulmonary disease, unspecified: Secondary | ICD-10-CM | POA: Diagnosis not present

## 2021-03-31 DIAGNOSIS — I255 Ischemic cardiomyopathy: Secondary | ICD-10-CM | POA: Diagnosis not present

## 2021-03-31 DIAGNOSIS — C3491 Malignant neoplasm of unspecified part of right bronchus or lung: Secondary | ICD-10-CM | POA: Diagnosis not present

## 2021-03-31 DIAGNOSIS — Z79899 Other long term (current) drug therapy: Secondary | ICD-10-CM | POA: Diagnosis not present

## 2021-03-31 DIAGNOSIS — J9811 Atelectasis: Secondary | ICD-10-CM | POA: Diagnosis not present

## 2021-03-31 DIAGNOSIS — Z9861 Coronary angioplasty status: Secondary | ICD-10-CM | POA: Diagnosis not present

## 2021-03-31 DIAGNOSIS — F17210 Nicotine dependence, cigarettes, uncomplicated: Secondary | ICD-10-CM | POA: Diagnosis not present

## 2021-03-31 DIAGNOSIS — Z515 Encounter for palliative care: Secondary | ICD-10-CM | POA: Diagnosis not present

## 2021-03-31 DIAGNOSIS — I110 Hypertensive heart disease with heart failure: Secondary | ICD-10-CM | POA: Diagnosis not present

## 2021-03-31 DIAGNOSIS — K7460 Unspecified cirrhosis of liver: Secondary | ICD-10-CM | POA: Diagnosis not present

## 2021-03-31 DIAGNOSIS — C3401 Malignant neoplasm of right main bronchus: Secondary | ICD-10-CM | POA: Diagnosis not present

## 2021-03-31 DIAGNOSIS — J9611 Chronic respiratory failure with hypoxia: Secondary | ICD-10-CM | POA: Diagnosis not present

## 2021-03-31 DIAGNOSIS — J440 Chronic obstructive pulmonary disease with acute lower respiratory infection: Secondary | ICD-10-CM | POA: Diagnosis not present

## 2021-03-31 DIAGNOSIS — I2510 Atherosclerotic heart disease of native coronary artery without angina pectoris: Secondary | ICD-10-CM | POA: Diagnosis not present

## 2021-03-31 DIAGNOSIS — Z9911 Dependence on respirator [ventilator] status: Secondary | ICD-10-CM | POA: Diagnosis not present

## 2021-03-31 DIAGNOSIS — I700 Atherosclerosis of aorta: Secondary | ICD-10-CM | POA: Diagnosis not present

## 2021-03-31 DIAGNOSIS — R918 Other nonspecific abnormal finding of lung field: Secondary | ICD-10-CM | POA: Diagnosis not present

## 2021-03-31 DIAGNOSIS — I10 Essential (primary) hypertension: Secondary | ICD-10-CM | POA: Diagnosis not present

## 2021-03-31 DIAGNOSIS — C384 Malignant neoplasm of pleura: Secondary | ICD-10-CM | POA: Diagnosis not present

## 2021-03-31 DIAGNOSIS — I5020 Unspecified systolic (congestive) heart failure: Secondary | ICD-10-CM | POA: Diagnosis not present

## 2021-03-31 DIAGNOSIS — J9621 Acute and chronic respiratory failure with hypoxia: Secondary | ICD-10-CM | POA: Diagnosis not present

## 2021-03-31 DIAGNOSIS — I5022 Chronic systolic (congestive) heart failure: Secondary | ICD-10-CM | POA: Diagnosis not present

## 2021-03-31 DIAGNOSIS — E785 Hyperlipidemia, unspecified: Secondary | ICD-10-CM | POA: Diagnosis not present

## 2021-03-31 DIAGNOSIS — Z87891 Personal history of nicotine dependence: Secondary | ICD-10-CM | POA: Diagnosis not present

## 2021-03-31 DIAGNOSIS — Z9981 Dependence on supplemental oxygen: Secondary | ICD-10-CM | POA: Diagnosis not present

## 2021-03-31 DIAGNOSIS — E669 Obesity, unspecified: Secondary | ICD-10-CM | POA: Diagnosis not present

## 2021-03-31 DIAGNOSIS — C3411 Malignant neoplasm of upper lobe, right bronchus or lung: Secondary | ICD-10-CM | POA: Diagnosis not present

## 2021-03-31 DIAGNOSIS — I469 Cardiac arrest, cause unspecified: Secondary | ICD-10-CM | POA: Diagnosis not present

## 2021-03-31 DIAGNOSIS — Z781 Physical restraint status: Secondary | ICD-10-CM | POA: Diagnosis not present

## 2021-03-31 DIAGNOSIS — N200 Calculus of kidney: Secondary | ICD-10-CM | POA: Diagnosis not present

## 2021-03-31 DIAGNOSIS — I952 Hypotension due to drugs: Secondary | ICD-10-CM | POA: Diagnosis not present

## 2021-03-31 DIAGNOSIS — R590 Localized enlarged lymph nodes: Secondary | ICD-10-CM | POA: Diagnosis not present

## 2021-03-31 DIAGNOSIS — I252 Old myocardial infarction: Secondary | ICD-10-CM | POA: Diagnosis not present

## 2021-03-31 DIAGNOSIS — J984 Other disorders of lung: Secondary | ICD-10-CM | POA: Diagnosis not present

## 2021-03-31 DIAGNOSIS — F32A Depression, unspecified: Secondary | ICD-10-CM | POA: Diagnosis not present

## 2021-03-31 DIAGNOSIS — Z720 Tobacco use: Secondary | ICD-10-CM | POA: Diagnosis not present

## 2021-03-31 DIAGNOSIS — G4733 Obstructive sleep apnea (adult) (pediatric): Secondary | ICD-10-CM | POA: Diagnosis not present

## 2021-03-31 DIAGNOSIS — R911 Solitary pulmonary nodule: Secondary | ICD-10-CM | POA: Diagnosis not present

## 2021-03-31 DIAGNOSIS — C3490 Malignant neoplasm of unspecified part of unspecified bronchus or lung: Secondary | ICD-10-CM | POA: Diagnosis not present

## 2021-03-31 DIAGNOSIS — Z8701 Personal history of pneumonia (recurrent): Secondary | ICD-10-CM | POA: Diagnosis not present

## 2021-03-31 DIAGNOSIS — Z4659 Encounter for fitting and adjustment of other gastrointestinal appliance and device: Secondary | ICD-10-CM | POA: Diagnosis not present

## 2021-03-31 DIAGNOSIS — R000 Tachycardia, unspecified: Secondary | ICD-10-CM | POA: Diagnosis not present

## 2021-03-31 DIAGNOSIS — Z049 Encounter for examination and observation for unspecified reason: Secondary | ICD-10-CM | POA: Diagnosis not present

## 2021-03-31 DIAGNOSIS — J189 Pneumonia, unspecified organism: Secondary | ICD-10-CM | POA: Diagnosis not present

## 2021-03-31 DIAGNOSIS — J9612 Chronic respiratory failure with hypercapnia: Secondary | ICD-10-CM | POA: Diagnosis not present

## 2021-03-31 DIAGNOSIS — Z66 Do not resuscitate: Secondary | ICD-10-CM | POA: Diagnosis not present

## 2021-03-31 DIAGNOSIS — J942 Hemothorax: Secondary | ICD-10-CM | POA: Diagnosis not present

## 2021-03-31 DIAGNOSIS — K219 Gastro-esophageal reflux disease without esophagitis: Secondary | ICD-10-CM | POA: Diagnosis not present

## 2021-03-31 DIAGNOSIS — J156 Pneumonia due to other aerobic Gram-negative bacteria: Secondary | ICD-10-CM | POA: Diagnosis not present

## 2021-03-31 DIAGNOSIS — J9809 Other diseases of bronchus, not elsewhere classified: Secondary | ICD-10-CM | POA: Diagnosis not present

## 2021-03-31 DIAGNOSIS — J9690 Respiratory failure, unspecified, unspecified whether with hypoxia or hypercapnia: Secondary | ICD-10-CM | POA: Diagnosis not present

## 2021-03-31 DIAGNOSIS — J441 Chronic obstructive pulmonary disease with (acute) exacerbation: Secondary | ICD-10-CM | POA: Diagnosis not present

## 2021-03-31 DIAGNOSIS — J9691 Respiratory failure, unspecified with hypoxia: Secondary | ICD-10-CM | POA: Diagnosis not present

## 2021-03-31 DIAGNOSIS — J9622 Acute and chronic respiratory failure with hypercapnia: Secondary | ICD-10-CM | POA: Diagnosis not present

## 2021-03-31 DIAGNOSIS — I5032 Chronic diastolic (congestive) heart failure: Secondary | ICD-10-CM | POA: Diagnosis not present

## 2021-03-31 DIAGNOSIS — J9819 Other pulmonary collapse: Secondary | ICD-10-CM | POA: Diagnosis not present

## 2021-03-31 DIAGNOSIS — J90 Pleural effusion, not elsewhere classified: Secondary | ICD-10-CM | POA: Diagnosis not present

## 2021-03-31 DIAGNOSIS — F419 Anxiety disorder, unspecified: Secondary | ICD-10-CM | POA: Diagnosis not present

## 2021-04-05 DIAGNOSIS — C3491 Malignant neoplasm of unspecified part of right bronchus or lung: Secondary | ICD-10-CM | POA: Diagnosis not present

## 2021-04-06 DIAGNOSIS — C3491 Malignant neoplasm of unspecified part of right bronchus or lung: Secondary | ICD-10-CM | POA: Diagnosis not present

## 2021-04-14 DIAGNOSIS — J441 Chronic obstructive pulmonary disease with (acute) exacerbation: Secondary | ICD-10-CM | POA: Diagnosis not present

## 2021-04-23 DEATH — deceased
